# Patient Record
Sex: Male | Born: 1970 | Race: Asian | Hispanic: No | Marital: Married | Smoking: Never smoker
Health system: Southern US, Community
[De-identification: ages and names within clinical notes are randomized; demographics above are authoritative.]

## PROBLEM LIST (undated history)

## (undated) DIAGNOSIS — J3089 Other allergic rhinitis: Secondary | ICD-10-CM

## (undated) DIAGNOSIS — R0602 Shortness of breath: Secondary | ICD-10-CM

## (undated) DIAGNOSIS — M19019 Primary osteoarthritis, unspecified shoulder: Secondary | ICD-10-CM

## (undated) DIAGNOSIS — Z8709 Personal history of other diseases of the respiratory system: Secondary | ICD-10-CM

## (undated) DIAGNOSIS — E039 Hypothyroidism, unspecified: Secondary | ICD-10-CM

## (undated) DIAGNOSIS — Z87898 Personal history of other specified conditions: Secondary | ICD-10-CM

## (undated) DIAGNOSIS — T7840XA Allergy, unspecified, initial encounter: Secondary | ICD-10-CM

## (undated) DIAGNOSIS — R0683 Snoring: Secondary | ICD-10-CM

## (undated) DIAGNOSIS — K4090 Unilateral inguinal hernia, without obstruction or gangrene, not specified as recurrent: Secondary | ICD-10-CM

## (undated) HISTORY — DX: Primary osteoarthritis, unspecified shoulder: M19.019

## (undated) HISTORY — DX: Allergy, unspecified, initial encounter: T78.40XA

## (undated) HISTORY — DX: Other allergic rhinitis: J30.89

## (undated) HISTORY — DX: Snoring: R06.83

## (undated) HISTORY — DX: Unilateral inguinal hernia, without obstruction or gangrene, not specified as recurrent: K40.90

## (undated) HISTORY — DX: Shortness of breath: R06.02

## (undated) HISTORY — DX: Hypothyroidism, unspecified: E03.9

## (undated) HISTORY — DX: Personal history of other specified conditions: Z87.898

## (undated) HISTORY — DX: Personal history of other diseases of the respiratory system: Z87.09

---

## 2010-03-10 DIAGNOSIS — Z8709 Personal history of other diseases of the respiratory system: Secondary | ICD-10-CM

## 2010-03-10 HISTORY — DX: Personal history of other diseases of the respiratory system: Z87.09

## 2010-11-22 ENCOUNTER — Encounter: Payer: Self-pay | Admitting: Internal Medicine

## 2010-11-22 ENCOUNTER — Other Ambulatory Visit (INDEPENDENT_AMBULATORY_CARE_PROVIDER_SITE_OTHER): Payer: BC Managed Care – PPO

## 2010-11-22 ENCOUNTER — Ambulatory Visit (INDEPENDENT_AMBULATORY_CARE_PROVIDER_SITE_OTHER): Payer: BC Managed Care – PPO | Admitting: Internal Medicine

## 2010-11-22 ENCOUNTER — Other Ambulatory Visit: Payer: Self-pay | Admitting: Internal Medicine

## 2010-11-22 VITALS — BP 110/80 | HR 80 | Temp 98.5°F | Resp 16 | Ht 68.0 in | Wt 186.0 lb

## 2010-11-22 DIAGNOSIS — Z0000 Encounter for general adult medical examination without abnormal findings: Secondary | ICD-10-CM

## 2010-11-22 DIAGNOSIS — Z8042 Family history of malignant neoplasm of prostate: Secondary | ICD-10-CM

## 2010-11-22 DIAGNOSIS — E039 Hypothyroidism, unspecified: Secondary | ICD-10-CM

## 2010-11-22 LAB — CBC WITH DIFFERENTIAL/PLATELET
Basophils Relative: 0.9 % (ref 0.0–3.0)
Eosinophils Relative: 1.7 % (ref 0.0–5.0)
HCT: 46.2 % (ref 39.0–52.0)
Monocytes Relative: 12.1 % — ABNORMAL HIGH (ref 3.0–12.0)
Neutrophils Relative %: 41.6 % — ABNORMAL LOW (ref 43.0–77.0)
Platelets: 252 10*3/uL (ref 150.0–400.0)
RBC: 5 Mil/uL (ref 4.22–5.81)
WBC: 7.6 10*3/uL (ref 4.5–10.5)

## 2010-11-22 LAB — URINALYSIS, ROUTINE W REFLEX MICROSCOPIC
Hgb urine dipstick: NEGATIVE
Ketones, ur: NEGATIVE
Urine Glucose: NEGATIVE
Urobilinogen, UA: 0.2 (ref 0.0–1.0)

## 2010-11-22 LAB — TSH: TSH: 5.3 u[IU]/mL (ref 0.35–5.50)

## 2010-11-22 LAB — LIPID PANEL
Total CHOL/HDL Ratio: 6
VLDL: 34 mg/dL (ref 0.0–40.0)

## 2010-11-22 MED ORDER — LEVOTHYROXINE SODIUM 88 MCG PO TABS: 88.0000 ug | ORAL_TABLET | Freq: Every day | ORAL | Status: DC

## 2010-11-22 NOTE — Assessment & Plan Note (Signed)
Restart synthroid ?

## 2010-11-22 NOTE — Patient Instructions (Signed)
Health Maintenance in Males MAINTAIN REGULAR HEALTH EXAMS  Maintain a healthy diet and normal weight. Increased weight leads to problems with blood pressure and diabetes. Decrease fat in the diet and increase exercise. Obtain a proper diet from your caregiver if necessary.   High blood pressure causes heart and blood vessel problems. Check blood pressures regularly and keep your blood pressure at normal limits. Aerobic exercise helps this. Persistent elevations of blood pressure should be treated with medications if weight loss and exercise are ineffective.   Avoid smoking, drinking in excess (more than 2 drinks per day), or use of street drugs. Do not share needles with anyone. Ask for help if you need assistance or instructions on stopping the use of alcohol, cigarettes, or drugs.   Maintain normal blood lipids and cholesterol. Your caregiver can give you information to lower your risk of heart disease or stroke.   Ask your caregiver if you are in need of early heart disease screening because of a strong family history of heart disease or signs of elevated testosterone (male sex hormone) levels. These can predispose you to early heart disease.   Practice safe sex. Practicing safe sex decreases your risk for a sexually transmitted infection (STI). Some of the STIs are gonorrhea, chlamydia, syphilis, trichimonas, herpes, human papillomavirus (HPV), and human immunodeficiency virus (HIV). Herpes, HIV, and HPV are viral illnesses that have no cure. These can result in disability, cancer, and death.   It is not safe for someone who has AIDS or is HIV positive to have unprotected sex with a partner who is HIV positive. The reason for this is the fact that there are many different strains of HIV. If you have a strain that is readily treated with medications and then suddenly introduce a strain from a partner that has no further treatment options, you may suddenly have a strain of HIV that is untreatable.  Even if you are both positive for HIV, it is still necessary to practice safe sex.   Use sunscreen with a SPF of 15 or greater. Being outside in the sun when your shadow caused by the sun is shorter than you are, means you are being exposed to sun at greater intensity. Lighter skinned people are at a greater risk of skin cancer.   Keep carbon monoxide and smoke detectors in your home and functioning at all times. Change the batteries every 6 months.   Do monthly examinations of your testicles. The best time to do this is after a hot shower or bath when the tissues are loose. Notify your caregivers of any lumps, tenderness, or changes in size or shape.   Notify your caregiver of new moles or changes in moles, especially if there is a change in shape or color. Also notify your caregiver if a mole is larger than the size of a pencil eraser.   Stay current with your tetanus shots and other required immunizations.  The Body Mass Index (BMI) is a way of measuring how much of your body is fat. Having a BMI above 27 increases the risk of heart disease, diabetes, hypertension, stroke, and other problems related to obesity. Document Released: 08/23/2007 Document Re-Released: 08/14/2009 ExitCare Patient Information 2011 [employer]. 

## 2010-11-22 NOTE — Assessment & Plan Note (Signed)
Exam done, labs ordered, pt ed material was given 

## 2010-11-22 NOTE — Progress Notes (Signed)
Subjective:    Patient ID: Bruce [name], male    DOB: May 21, 1970, 40 y.o.   MRN: [id number]  Thyroid Problem Presents for follow-up visit. Symptoms include fatigue and weight gain. Patient reports no anxiety, cold intolerance, constipation, depressed mood, diaphoresis, diarrhea, dry skin, hair loss, heat intolerance, hoarse voice, leg swelling, menstrual problem, nail problem, palpitations, tremors, visual change or weight loss. The symptoms have been worsening (he has not taken synthroid for about 3 weeks).      Review of Systems  Constitutional: Positive for weight gain, fatigue and unexpected weight change (weight gain). Negative for fever, chills, weight loss, diaphoresis, activity change and appetite change.  HENT: Negative for sore throat, hoarse voice, facial swelling, trouble swallowing, neck pain, neck stiffness and voice change.   Eyes: Negative.   Respiratory: Negative for apnea, cough, choking, chest tightness, shortness of breath, wheezing and stridor.   Cardiovascular: Negative for chest pain, palpitations and leg swelling.  Gastrointestinal: Negative for nausea, vomiting, abdominal pain, diarrhea, constipation, blood in stool, abdominal distention and anal bleeding.  Genitourinary: Negative for dysuria, urgency, frequency, hematuria, flank pain, decreased urine volume, discharge, penile swelling, scrotal swelling, enuresis, difficulty urinating, genital sores, penile pain, testicular pain and menstrual problem.  Musculoskeletal: Negative for myalgias, back pain, joint swelling, arthralgias and gait problem.  Skin: Negative for color change, pallor, rash and wound.  Neurological: Negative for dizziness, tremors, seizures, syncope, facial asymmetry, speech difficulty, weakness, light-headedness, numbness and headaches.  Hematological: Negative for cold intolerance, heat intolerance and adenopathy. Does not bruise/bleed easily.  Psychiatric/Behavioral: Negative.          Objective:   Physical Exam  Vitals reviewed. Constitutional: He is oriented to person, place, and time. He appears well-developed and well-nourished. No distress.  HENT:  Mouth/Throat: Oropharynx is clear and moist. No oropharyngeal exudate.  Eyes: Conjunctivae are normal. Right eye exhibits no discharge. Left eye exhibits no discharge. No scleral icterus.  Neck: Normal range of motion. Neck supple. No JVD present. No tracheal deviation present. No thyromegaly present.  Cardiovascular: Normal rate, regular rhythm, normal heart sounds and intact distal pulses.  Exam reveals no gallop and no friction rub.   No murmur heard. Pulmonary/Chest: Effort normal and breath sounds normal. No stridor. No respiratory distress. He has no wheezes. He has no rales. He exhibits no tenderness.  Abdominal: Soft. Bowel sounds are normal. He exhibits no distension and no mass. There is no tenderness. There is no rebound and no guarding. Hernia confirmed negative in the right inguinal area and confirmed negative in the left inguinal area.  Genitourinary: Rectum normal, prostate normal, testes normal and penis normal. Rectal exam shows no external hemorrhoid, no internal hemorrhoid, no fissure, no mass, no tenderness and anal tone normal. Guaiac negative stool. Prostate is not enlarged and not tender. Right testis shows no mass, no swelling and no tenderness. Right testis is descended. Left testis shows no mass, no swelling and no tenderness. Left testis is descended. Uncircumcised. No phimosis, paraphimosis, hypospadias, penile erythema or penile tenderness. No discharge found.  Musculoskeletal: Normal range of motion. He exhibits no edema and no tenderness.  Lymphadenopathy:    He has no cervical adenopathy.       Right: No inguinal adenopathy present.       Left: No inguinal adenopathy present.  Neurological: He is oriented to person, place, and time. He displays normal reflexes. He exhibits normal muscle tone.   Skin: Skin is warm and dry. No rash noted. He is not diaphoretic.  No erythema. No pallor.  Psychiatric: He has a normal mood and affect. His behavior is normal. Judgment and thought content normal.          Assessment & Plan:

## 2010-11-22 NOTE — Assessment & Plan Note (Signed)
PSA and UA today 

## 2010-11-25 ENCOUNTER — Encounter: Payer: Self-pay | Admitting: Internal Medicine

## 2010-11-25 LAB — COMPREHENSIVE METABOLIC PANEL
Albumin: 4.2 g/dL (ref 3.5–5.2)
CO2: 25 mEq/L (ref 19–32)
Calcium: 9.2 mg/dL (ref 8.4–10.5)
GFR: 100.91 mL/min (ref >60.00)
Glucose, Bld: 103 mg/dL — ABNORMAL HIGH (ref 70–99)
Potassium: 3.9 mEq/L (ref 3.5–5.1)
Sodium: 141 mEq/L (ref 135–145)
Total Protein: 7.2 g/dL (ref 6.0–8.3)

## 2010-11-26 ENCOUNTER — Ambulatory Visit: Payer: Self-pay | Admitting: Internal Medicine

## 2011-03-11 DIAGNOSIS — M19019 Primary osteoarthritis, unspecified shoulder: Secondary | ICD-10-CM

## 2011-03-11 HISTORY — DX: Primary osteoarthritis, unspecified shoulder: M19.019

## 2011-07-15 ENCOUNTER — Ambulatory Visit: Payer: BC Managed Care – PPO | Admitting: Family Medicine

## 2011-07-18 ENCOUNTER — Encounter: Payer: Self-pay | Admitting: Family Medicine

## 2011-07-18 ENCOUNTER — Ambulatory Visit (INDEPENDENT_AMBULATORY_CARE_PROVIDER_SITE_OTHER): Payer: BC Managed Care – PPO | Admitting: Family Medicine

## 2011-07-18 VITALS — BP 114/75 | HR 51 | Temp 98.0°F | Ht 68.0 in | Wt 183.8 lb

## 2011-07-18 DIAGNOSIS — Z0000 Encounter for general adult medical examination without abnormal findings: Secondary | ICD-10-CM

## 2011-07-18 DIAGNOSIS — E039 Hypothyroidism, unspecified: Secondary | ICD-10-CM

## 2011-07-18 DIAGNOSIS — Z8042 Family history of malignant neoplasm of prostate: Secondary | ICD-10-CM

## 2011-07-18 NOTE — Progress Notes (Signed)
Office Note 07/22/2011  CC:  Chief Complaint  Patient presents with  . Establish Care    new patient    HPI:  Bruce [name] is a 41 y.o. [place] male who is here to establish care and get CPE. Patient's most recent primary MD: saw Dr. Yetta Barre at the [facility] office on [address] 11/2010.  Also, has seen a PA named Gwenlyn Perking at [place] [facility] in [facility] since that time, therefore he is classified as a new pt to [place]. Old records in EPIC/HL were reviewed prior to or during today's visit.  He mentions problems with fatigue, excessive daytime sleepiness, snoring + wife notes periods of apnea during sleep.  Recurrent URI's last few months that he feels may be related to OSA. No RLS.  He sees an endocrinologist in [place] and recently had his brand name synthroid dose increased from to 100 mcg qd (2 mo ago per pt).   Past Medical History  Diagnosis Date  . Hypothyroidism     Managed by endo ([facility])  . Jaundice as a child  . 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    History reviewed. No pertinent past surgical history.  Family History  Problem Relation Age of Onset  . Hypertension Mother   . Prostate cancer Father   . Cancer Father 46    prostate    History   Social History  . Marital Status: Married    Spouse Name: N/A    Number of Children: N/A  . Years of Education: N/A   Occupational History  . Not on file.   Social History Main Topics  . Smoking status: Never Smoker   . Smokeless tobacco: Never Used  . Alcohol Use: 0.6 oz/week    1 Shots of liquor per week     occasionally  . Drug Use: No  . Sexually Active: Yes   Other Topics Concern  . Not on file   Social History Narrative   Married, 2 children (ages 64 and 2).Master's degree in [place].  Emergency planning/management officer for [employer]No tobacco, rare alcohol, no  drug use.Exercises regularly.  Typical american diet except avoids red meat.     Outpatient Encounter Prescriptions as of 07/18/2011  Medication Sig Dispense Refill  . levothyroxine (SYNTHROID, LEVOTHROID) 100 MCG tablet Take 100 mcg by mouth daily.      Marland Kitchen DISCONTD: levothyroxine (SYNTHROID, LEVOTHROID) 88 MCG tablet Take 1 tablet (88 mcg total) by mouth daily.  90 tablet  3    No Known Allergies  ROS Review of Systems  Constitutional: Negative for fever, chills, appetite change and fatigue.  HENT: Negative for ear pain, congestion, sore throat, neck stiffness and dental problem.   Eyes: Negative for discharge, redness and visual disturbance.  Respiratory: Negative for cough, chest tightness, shortness of breath and wheezing.   Cardiovascular: Negative for chest pain, palpitations and leg swelling.  Gastrointestinal: Negative for nausea, vomiting, abdominal pain, diarrhea and blood in stool.  Genitourinary: Negative for dysuria, urgency, frequency, hematuria, flank pain and difficulty urinating.  Musculoskeletal: Positive for arthralgias (left shoulder). Negative for myalgias, back pain and joint swelling.  Skin: Negative for pallor and rash.  Neurological: Negative for dizziness, speech difficulty, weakness and headaches.  Hematological: Negative for adenopathy. Does not bruise/bleed easily.  Psychiatric/Behavioral: Negative for confusion and sleep disturbance. The patient  is not nervous/anxious.     PE; Blood pressure 114/75, pulse 51, temperature 98 F (36.7 C), temperature source Temporal, height 5\' 8"  (1.727 m), weight 183 lb 12.8 oz (83.371 kg), SpO2 98.00%. Gen: Alert, well appearing.  Patient is oriented to person, place, time, and situation. ENT: Ears: EACs clear, normal epithelium.  TMs with good light reflex and landmarks bilaterally.  Eyes: no injection, icteris, swelling, or exudate.  EOMI, PERRLA. Nose: no drainage or turbinate edema/swelling.  No injection or focal  lesion.  Mouth: lips without lesion/swelling.  Oral mucosa pink and moist.  Dentition intact and without obvious caries or gingival swelling.  Oropharynx without erythema, exudate, or swelling.  Neck: supple/nontender.  No LAD, mass, or TM.  Carotid pulses 2+ bilaterally, without bruits. CV: RRR, no m/r/g.   LUNGS: CTA bilat, nonlabored resps, good aeration in all lung fields. ABD: soft, NT, ND, BS normal.  No hepatospenomegaly or mass.  No bruits. EXT: no clubbing, cyanosis, or edema.  Skin - no sores or suspicious lesions or rashes or color changes Rectal/prostate: deferred (this was done on exam 11/2010 and was normal.  Pertinent labs:    Chemistry      Component Value Date/Time   NA 141 11/22/2010 1525   K 3.9 11/22/2010 1525   CL 106 11/22/2010 1525   CO2 25 11/22/2010 1525   BUN 16 11/22/2010 1525   CREATININE 0.9 11/22/2010 1525      Component Value Date/Time   CALCIUM 9.2 11/22/2010 1525   ALKPHOS 67 11/22/2010 1525   AST 20 11/22/2010 1525   ALT 21 11/22/2010 1525   BILITOT 0.7 11/22/2010 1525     Lab Results  Component Value Date   WBC 7.6 11/22/2010   HGB 15.7 11/22/2010   HCT 46.2 11/22/2010   MCV 92.5 11/22/2010   PLT 252.0 11/22/2010   Lab Results  Component Value Date   PSA 0.63 11/22/2010   Lab Results  Component Value Date   TSH 5.30 11/22/2010     ASSESSMENT AND PLAN:   Health maintenance examination Reviewed age and gender appropriate health maintenance issues (prudent diet, regular exercise, health risks of tobacco and excessive alcohol, use of seatbelts, fire alarms in home, use of sunscreen).  Also reviewed age and gender appropriate health screening as well as vaccine recommendations. Will see if old records at [facility] show vaccine record. PSA and other routine labs were done 11/2010 and were unremarkable so no labs are indicated today. He gives some history suggestive of OSA, although sleep questionnaire today was low risk. I offered to order a home sleep  study but he said he wanted to wait and discuss it more with his wife.  Family history of prostate cancer Gets annual PSAs and DREs since turning 38--normal so far.  Hypothyroidism As per specialist management.       Return if symptoms worsen or fail to improve.

## 2011-07-18 NOTE — Patient Instructions (Signed)
Health Maintenance, Males A healthy lifestyle and preventative care can promote health and wellness.  Maintain regular health, dental, and eye exams.   Eat a healthy diet. Foods like vegetables, fruits, whole grains, low-fat dairy products, and lean protein foods contain the nutrients you need without too many calories. Decrease your intake of foods high in solid fats, added sugars, and salt. Get information about a proper diet from your caregiver, if necessary.   Regular physical exercise is one of the most important things you can do for your health. Most adults should get at least 150 minutes of moderate-intensity exercise (any activity that increases your heart rate and causes you to sweat) each week. In addition, most adults need muscle-strengthening exercises on 2 or more days a week.    Maintain a healthy weight. The body mass index (BMI) is a screening tool to identify possible weight problems. It provides an estimate of body fat based on height and weight. Your caregiver can help determine your BMI, and can help you achieve or maintain a healthy weight. For adults 20 years and older:   A BMI below 18.5 is considered underweight.   A BMI of 18.5 to 24.9 is normal.   A BMI of 25 to 29.9 is considered overweight.   A BMI of 30 and above is considered obese.   Maintain normal blood lipids and cholesterol by exercising and minimizing your intake of saturated fat. Eat a balanced diet with plenty of fruits and vegetables. Blood tests for lipids and cholesterol should begin at age 20 and be repeated every 5 years. If your lipid or cholesterol levels are high, you are over 50, or you are a high risk for heart disease, you may need your cholesterol levels checked more frequently.Ongoing high lipid and cholesterol levels should be treated with medicines, if diet and exercise are not effective.   If you smoke, find out from your caregiver how to quit. If you do not use tobacco, do not start.    If you choose to drink alcohol, do not exceed 2 drinks per day. One drink is considered to be 12 ounces (355 mL) of beer, 5 ounces (148 mL) of wine, or 1.5 ounces (44 mL) of liquor.   Avoid use of street drugs. Do not share needles with anyone. Ask for help if you need support or instructions about stopping the use of drugs.   High blood pressure causes heart disease and increases the risk of stroke. Blood pressure should be checked at least every 1 to 2 years. Ongoing high blood pressure should be treated with medicines if weight loss and exercise are not effective.   If you are 45 to 43 years old, ask your caregiver if you should take aspirin to prevent heart disease.   Diabetes screening involves taking a blood sample to check your fasting blood sugar level. This should be done once every 3 years, after age 45, if you are within normal weight and without risk factors for diabetes. Testing should be considered at a younger age or be carried out more frequently if you are overweight and have at least 1 risk factor for diabetes.   Colorectal cancer can be detected and often prevented. Most routine colorectal cancer screening begins at the age of 50 and continues through age 75. However, your caregiver may recommend screening at an earlier age if you have risk factors for colon cancer. On a yearly basis, your caregiver may provide home test kits to check for hidden   blood in the stool. Use of a small camera at the end of a tube, to directly examine the colon (sigmoidoscopy or colonoscopy), can detect the earliest forms of colorectal cancer. Talk to your caregiver about this at age 50, when routine screening begins. Direct examination of the colon should be repeated every 5 to 10 years through age 75, unless early forms of pre-cancerous polyps or small growths are found.   Hepatitis C blood testing is recommended for all people born from 1945 through 1965 and any individual with known risks for  hepatitis C.   Healthy men should no longer receive prostate-specific antigen (PSA) blood tests as part of routine cancer screening. Consult with your caregiver about prostate cancer screening.   Testicular cancer screening is not recommended for adolescents or adult males who have no symptoms. Screening includes self-exam, caregiver exam, and other screening tests. Consult with your caregiver about any symptoms you have or any concerns you have about testicular cancer.   Practice safe sex. Use condoms and avoid high-risk sexual practices to reduce the spread of sexually transmitted infections (STIs).   Use sunscreen with a sun protection factor (SPF) of 30 or greater. Apply sunscreen liberally and repeatedly throughout the day. You should seek shade when your shadow is shorter than you. Protect yourself by wearing long sleeves, pants, a wide-brimmed hat, and sunglasses year round, whenever you are outdoors.   Notify your caregiver of new moles or changes in moles, especially if there is a change in shape or color. Also notify your caregiver if a mole is larger than the size of a pencil eraser.   A one-time screening for abdominal aortic aneurysm (AAA) and surgical repair of large AAAs by sound wave imaging (ultrasonography) is recommended for ages 65 to 75 years who are current or former smokers.   Stay current with your immunizations.  Document Released: 08/23/2007 Document Revised: 02/13/2011 Document Reviewed: 07/22/2010 ExitCare Patient Information 2012 [employer]. 

## 2011-07-22 ENCOUNTER — Encounter: Payer: Self-pay | Admitting: Family Medicine

## 2011-07-22 DIAGNOSIS — E039 Hypothyroidism, unspecified: Secondary | ICD-10-CM | POA: Insufficient documentation

## 2011-07-22 DIAGNOSIS — Z0000 Encounter for general adult medical examination without abnormal findings: Secondary | ICD-10-CM | POA: Insufficient documentation

## 2011-07-22 NOTE — Assessment & Plan Note (Addendum)
Reviewed age and gender appropriate health maintenance issues (prudent diet, regular exercise, health risks of tobacco and excessive alcohol, use of seatbelts, fire alarms in home, use of sunscreen).  Also reviewed age and gender appropriate health screening as well as vaccine recommendations. Will see if old records at [facility] show vaccine record. PSA and other routine labs were done 11/2010 and were unremarkable so no labs are indicated today. He gives some history suggestive of OSA, although sleep questionnaire today was low risk. I offered to order a home sleep study but he said he wanted to wait and discuss it more with his wife.

## 2011-07-22 NOTE — Assessment & Plan Note (Signed)
As per specialist management.

## 2011-07-22 NOTE — Assessment & Plan Note (Signed)
Gets annual PSAs and DREs since turning 38--normal so far.

## 2011-12-05 ENCOUNTER — Other Ambulatory Visit: Payer: Self-pay | Admitting: Family Medicine

## 2011-12-05 ENCOUNTER — Telehealth: Payer: Self-pay | Admitting: Family Medicine

## 2011-12-05 DIAGNOSIS — M19019 Primary osteoarthritis, unspecified shoulder: Secondary | ICD-10-CM

## 2011-12-05 DIAGNOSIS — E039 Hypothyroidism, unspecified: Secondary | ICD-10-CM

## 2011-12-05 NOTE — Telephone Encounter (Signed)
Can you enter orders or do I need to call pt for more information?

## 2011-12-05 NOTE — Telephone Encounter (Signed)
I'll put in the orders for the endocrinologist and the orthopedist.  Ask him if he wants a sleep study in the sleep lab or does he want a home sleep study (we had discussed a home sleep study as a possibility when he was in the office last).  Let me know and I'll put the order in.--thx

## 2011-12-09 ENCOUNTER — Other Ambulatory Visit: Payer: Self-pay | Admitting: Family Medicine

## 2011-12-09 ENCOUNTER — Telehealth: Payer: Self-pay | Admitting: Family Medicine

## 2011-12-09 DIAGNOSIS — R400 Somnolence: Secondary | ICD-10-CM

## 2011-12-09 NOTE — Telephone Encounter (Signed)
OK.  Will put order/referral in EMR.-thx

## 2011-12-09 NOTE — Telephone Encounter (Signed)
Patient is requesting sleep referral with [facility].

## 2011-12-16 NOTE — Telephone Encounter (Signed)
Pt spoke with Diane and sleep study has been ordered through [facility].

## 2011-12-30 ENCOUNTER — Telehealth: Payer: Self-pay | Admitting: Family Medicine

## 2011-12-30 NOTE — Telephone Encounter (Signed)
Please advise 

## 2011-12-30 NOTE — Telephone Encounter (Signed)
Pt notified.  Pt does not want to go to UC and pay extra fees knowing they will prescribe him the same medication as always.  Pt states he knows it is just a cold and he just needs and antibiotic.  Advised colds are viral and antibiotics do not take care of viral illnesses.  Asked pt twice what he was taking OTC and both times pt began talking about symptoms and getting this illness every two months.  Advised again to seek care at an UC and if unable to do that and still feeling bad when he returns home to call for an appt.

## 2011-12-30 NOTE — Telephone Encounter (Signed)
Please notify this patient that I'm sorry but calling in an antibiotic is not in his best interest from a health standpoint b/c I have not seen him and evaluated him for this illness.  He needs to see an MD where he is currently so he can get accurate diagnosis and appropriate treatment.  --thx

## 2011-12-30 NOTE — Telephone Encounter (Signed)
Caller: [name]/Patient; Patient Name: Bruce [name]; PCP: Earley Favor [facility]); Best Callback Phone Number: [phone] Onset- 12/26/11  Pt feels like he may have a fever but he is in hotel with no access to thermometer right now.  He has cough, nasal congestion with yelow-green drainage, headache and feels achy. He is using  Catering manager and  Tylenol. Emergent s/s of cough protocol r/o. Pt to see provider within 24hrs. Pt is aware of antibiotic policy must be seen in office. Pt is requesting a message be sent he is out of town and wants to know if if an antibiotic can be sent to pharmacy so he will not have to go to Urgent care. Pharmacy is [facility], phone [phone]. The address is [address]. [place], [place]  [address]

## 2012-01-09 HISTORY — PX: OTHER SURGICAL HISTORY: SHX169

## 2012-02-04 ENCOUNTER — Encounter: Payer: Self-pay | Admitting: Family Medicine

## 2012-02-04 ENCOUNTER — Telehealth: Payer: Self-pay | Admitting: Family Medicine

## 2012-02-04 NOTE — Telephone Encounter (Signed)
Pls notify pt that his home sleep study was normal.  No obstructive sleep apnea.-thx

## 2012-02-06 NOTE — Telephone Encounter (Signed)
I have attempted to contact this patient by phone with the following results: left message to return my call on answering machine (mobile).  

## 2012-02-06 NOTE — Telephone Encounter (Signed)
Pt notified and is agreeable with plan. 

## 2012-02-16 ENCOUNTER — Encounter: Payer: Self-pay | Admitting: Family Medicine

## 2012-02-26 ENCOUNTER — Telehealth: Payer: Self-pay | Admitting: Family Medicine

## 2012-02-26 NOTE — Telephone Encounter (Signed)
Pt notified.and agreeable

## 2012-02-26 NOTE — Telephone Encounter (Signed)
Pls notify pt that his sleep study was normal: NO sign of obstructive sleep apnea.  Reassure patient.--thx

## 2012-07-08 ENCOUNTER — Ambulatory Visit (INDEPENDENT_AMBULATORY_CARE_PROVIDER_SITE_OTHER)
Admission: RE | Admit: 2012-07-08 | Discharge: 2012-07-08 | Disposition: A | Discharge status: Home / Self Care | Payer: 59 | Source: Ambulatory Visit | Attending: Family Medicine | Admitting: Family Medicine

## 2012-07-08 ENCOUNTER — Ambulatory Visit (INDEPENDENT_AMBULATORY_CARE_PROVIDER_SITE_OTHER): Payer: 59 | Admitting: Family Medicine

## 2012-07-08 ENCOUNTER — Telehealth: Payer: Self-pay | Admitting: Family Medicine

## 2012-07-08 ENCOUNTER — Encounter: Payer: Self-pay | Admitting: Family Medicine

## 2012-07-08 VITALS — BP 120/78 | HR 74 | Temp 98.0°F | Resp 14 | Wt 189.0 lb

## 2012-07-08 DIAGNOSIS — S63619A Unspecified sprain of unspecified finger, initial encounter: Secondary | ICD-10-CM

## 2012-07-08 DIAGNOSIS — S6390XA Sprain of unspecified part of unspecified wrist and hand, initial encounter: Secondary | ICD-10-CM

## 2012-07-08 NOTE — Telephone Encounter (Signed)
Discussed x-ray result with pt: fracture of proximal/dorsal aspect of distal phalanx of 3rd finger on left hand, extending into articular surface of dist phalanx at the DIP jt. I recommended we have an orthopedist see him but he preferred to wait and see how his finger feels over the next week and f/u with me in 1 wk as planned (wear splint in the meantime). I agreed with this option, but I will ask a [facility] MD to view the x-ray and get their input on things.

## 2012-07-08 NOTE — Progress Notes (Signed)
OFFICE NOTE  07/08/2012  CC:  Chief Complaint  Patient presents with  . Finger Injury    Pt c/o Left middle finger injury w/swelling & bruising     HPI: Patient is a 42 y.o. middle [place] male who is here for finger injury. Occurred yesterday, playing basketball with son, thinks he jammed it and bent it forward at the tip as well.  Pertinent PMH:  Past Medical History  Diagnosis Date  . Hypothyroidism     Managed by endo ([facility])  . History of influenza 2012    influenza A + ([facility])  . 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   Past Surgical History  Procedure Laterality Date  . Sleep study  01/2012    Home sleep study via [employer] [facility]     MEDS:  Outpatient Prescriptions Prior to Visit  Medication Sig Dispense Refill  . levothyroxine (SYNTHROID, LEVOTHROID) 100 MCG tablet Take 100 mcg by mouth daily.       No facility-administered medications prior to visit.    PE: Blood pressure 120/78, pulse 74, temperature 98 F (36.7 C), temperature source Oral, resp. rate 14, weight 189 lb (85.73 kg), SpO2 98.00%. Gen: Alert, well appearing.  Patient is oriented to person, place, time, and situation. Left hand middle finger with a mild amount of erythema and swelling of DIP joint and distal phalanx.  He can flex and extend at the DIP joint ok.  Mild tenderness of DIP joint and distal phalanx of middle finger of left hand.  IMPRESSION AND PLAN:  Left hand middle finger sprain, r/o fracture. Will buddy tape and splint in office today.  Ibuprofen for pain/swelling discussed. Will refer to ortho if appropriate based on x-ray.  FOLLOW UP: 1 wk

## 2012-07-12 ENCOUNTER — Telehealth: Payer: Self-pay | Admitting: Family Medicine

## 2012-07-12 DIAGNOSIS — S62639A Displaced fracture of distal phalanx of unspecified finger, initial encounter for closed fracture: Secondary | ICD-10-CM

## 2012-07-12 NOTE — Telephone Encounter (Signed)
Pls contact pt and notify him that I discussed his case with one of my colleagues in Sports Medicine and he recommended that I refer him to an orthopedist for further evaluation and management of his fractured finger.  Continue wearing split until he sees orthopedist.  The order is in so he should be getting a call about the ortho appt soon.-thx

## 2012-07-12 NOTE — Telephone Encounter (Signed)
Attempted to reach patient via phone; home number states "your number cannot be completed as dialed [[phone]]", attempted to reach pt via mobile number, ringing busy/SLS

## 2012-07-12 NOTE — Telephone Encounter (Signed)
Patient informed, understood & agreed/SLS  

## 2012-07-15 ENCOUNTER — Ambulatory Visit: Payer: 59 | Admitting: Family Medicine

## 2013-06-22 ENCOUNTER — Other Ambulatory Visit: Payer: 59

## 2013-06-24 ENCOUNTER — Other Ambulatory Visit (INDEPENDENT_AMBULATORY_CARE_PROVIDER_SITE_OTHER): Payer: 59

## 2013-06-24 ENCOUNTER — Other Ambulatory Visit: Payer: Self-pay | Admitting: *Deleted

## 2013-06-24 DIAGNOSIS — E039 Hypothyroidism, unspecified: Secondary | ICD-10-CM

## 2013-06-24 LAB — TSH: TSH: 2.06 u[IU]/mL (ref 0.35–5.50)

## 2013-06-24 LAB — T4, FREE: Free T4: 1.01 ng/dL (ref 0.60–1.60)

## 2013-06-29 ENCOUNTER — Ambulatory Visit (INDEPENDENT_AMBULATORY_CARE_PROVIDER_SITE_OTHER): Payer: 59 | Admitting: Endocrinology

## 2013-06-29 ENCOUNTER — Encounter: Payer: Self-pay | Admitting: Endocrinology

## 2013-06-29 VITALS — BP 126/84 | HR 69 | Temp 98.1°F | Resp 14 | Ht 68.5 in | Wt 185.8 lb

## 2013-06-29 DIAGNOSIS — E039 Hypothyroidism, unspecified: Secondary | ICD-10-CM

## 2013-06-29 DIAGNOSIS — E7800 Pure hypercholesterolemia, unspecified: Secondary | ICD-10-CM

## 2013-06-29 NOTE — Progress Notes (Signed)
Patient ID: Bruce SeltzerSharan [name], male   DOB: 11-16-1970, 43 y.o.   MRN: [id number]  Reason for Appointment:  Hypothyroidism, followup visit    History of Present Illness:   The hypothyroidism was first diagnosed in 2012  Initially had only nonspecific fatigue and some cord intolerance. No details are available about the initial diagnosis but his TSH in 2012 was 5.3 The patient has been treated with Synthroid brand name 100 mcg daily The last visit was in 4/14   Patient has no complaints of unusual fatigue, cold sensitivity, dry skin, unusual weight gain or hair loss.            The patient is taking the thyroid supplement very regularly in the morning before breakfast. Not taking any calcium or iron supplements with the thyroid supplement.    On the last visit the TSH was 2.4 and the dose was continued unchanged He is asking about generic levothyroxine instead of brand name  Appointment on 06/24/2013  Component Date Value Ref Range Status  . TSH 06/24/2013 2.06  0.35 - 5.50 uIU/mL Final  . Free T4 06/24/2013 1.01  0.60 - 1.60 ng/dL Final      Medication List       This list is accurate as of: 06/29/13  2:30 PM.  Always use your most recent med list.               COMPLETE PROTEIN/VITAMIN SHAKE PO  Take by mouth.     levothyroxine 100 MCG tablet  Commonly known as:  SYNTHROID, LEVOTHROID  Take 100 mcg by mouth daily.     multivitamin tablet  Take 1 tablet by mouth daily.        Allergies: No Known Allergies  Past Medical History  Diagnosis Date  . Hypothyroidism     Managed by endo ([facility])  . History of influenza 2012    influenza A + ([facility])  . 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    Past Surgical History  Procedure Laterality Date  . Sleep study  01/2012    Home sleep study via [employer] [facility]     Family History  Problem Relation Age of Onset  . Hypertension Mother   . Prostate cancer Father   . Cancer Father [id number]    prostate    Social History:  reports that he has never smoked. He has never used smokeless tobacco. He reports that he drinks about .6 ounces of alcohol per week. He reports that he does not use illicit drugs.  REVIEW Of SYSTEMS:  No history of hypertension  Previous LDL was 163   Examination:   BP 126/84  Pulse 69  Temp(Src) 98.1 F (36.7 C)  Resp 14  Ht 5' 8.5" (1.74 m)  Wt 185 lb 12.8 oz (84.278 kg)  BMI 27.84 kg/m2  SpO2 96%  GENERAL APPEARANCE: Alert, no puffiness of the face or eyes  NECK: Thyroid is not palpable           NEUROLOGIC EXAM:  biceps reflexes show normal relaxation Skin: Not unusual dry    Assessment   Hypothyroidism, primary and mild, very stable with normal TSH on current regimen of 100 mcg daily Discussed with patient nature of autoimmune hypothyroidism and long-term natural history Since his levels have been very stable with brand name Synthroid would like  him to continue this but may consider generic also  Hypercholesterolemia: He will continue followup with PCP with this   Treatment:   Continue same dosage before breakfast daily. Avoid taking any calcium or iron supplements with the thyroid supplement.    Reather LittlerAjay Halah [name] 06/29/2013, 2:30 PM

## 2013-11-07 ENCOUNTER — Other Ambulatory Visit: Payer: Self-pay | Admitting: *Deleted

## 2013-11-07 MED ORDER — LEVOTHYROXINE SODIUM 100 MCG PO TABS: 100.0000 ug | ORAL_TABLET | Freq: Every day | ORAL | Status: DC

## 2013-11-11 ENCOUNTER — Telehealth: Payer: Self-pay | Admitting: Endocrinology

## 2013-11-11 ENCOUNTER — Other Ambulatory Visit: Payer: Self-pay | Admitting: *Deleted

## 2013-11-11 MED ORDER — LEVOTHYROXINE SODIUM 100 MCG PO TABS: 100.0000 ug | ORAL_TABLET | Freq: Every day | ORAL | Status: DC

## 2013-11-11 NOTE — Telephone Encounter (Signed)
Pt needs rx called in for synthryoid

## 2014-01-02 IMAGING — CR DG FINGER MIDDLE 2+V*L*
3 series · 3 of 3 positions shown · non-contrast
Comparison: None.

CLINICAL DATA: History of injury of the middle finger of the left
hand with sprain and contusion.

LEFT MIDDLE FINGER 2+V

[view not recorded (1 of 3)]
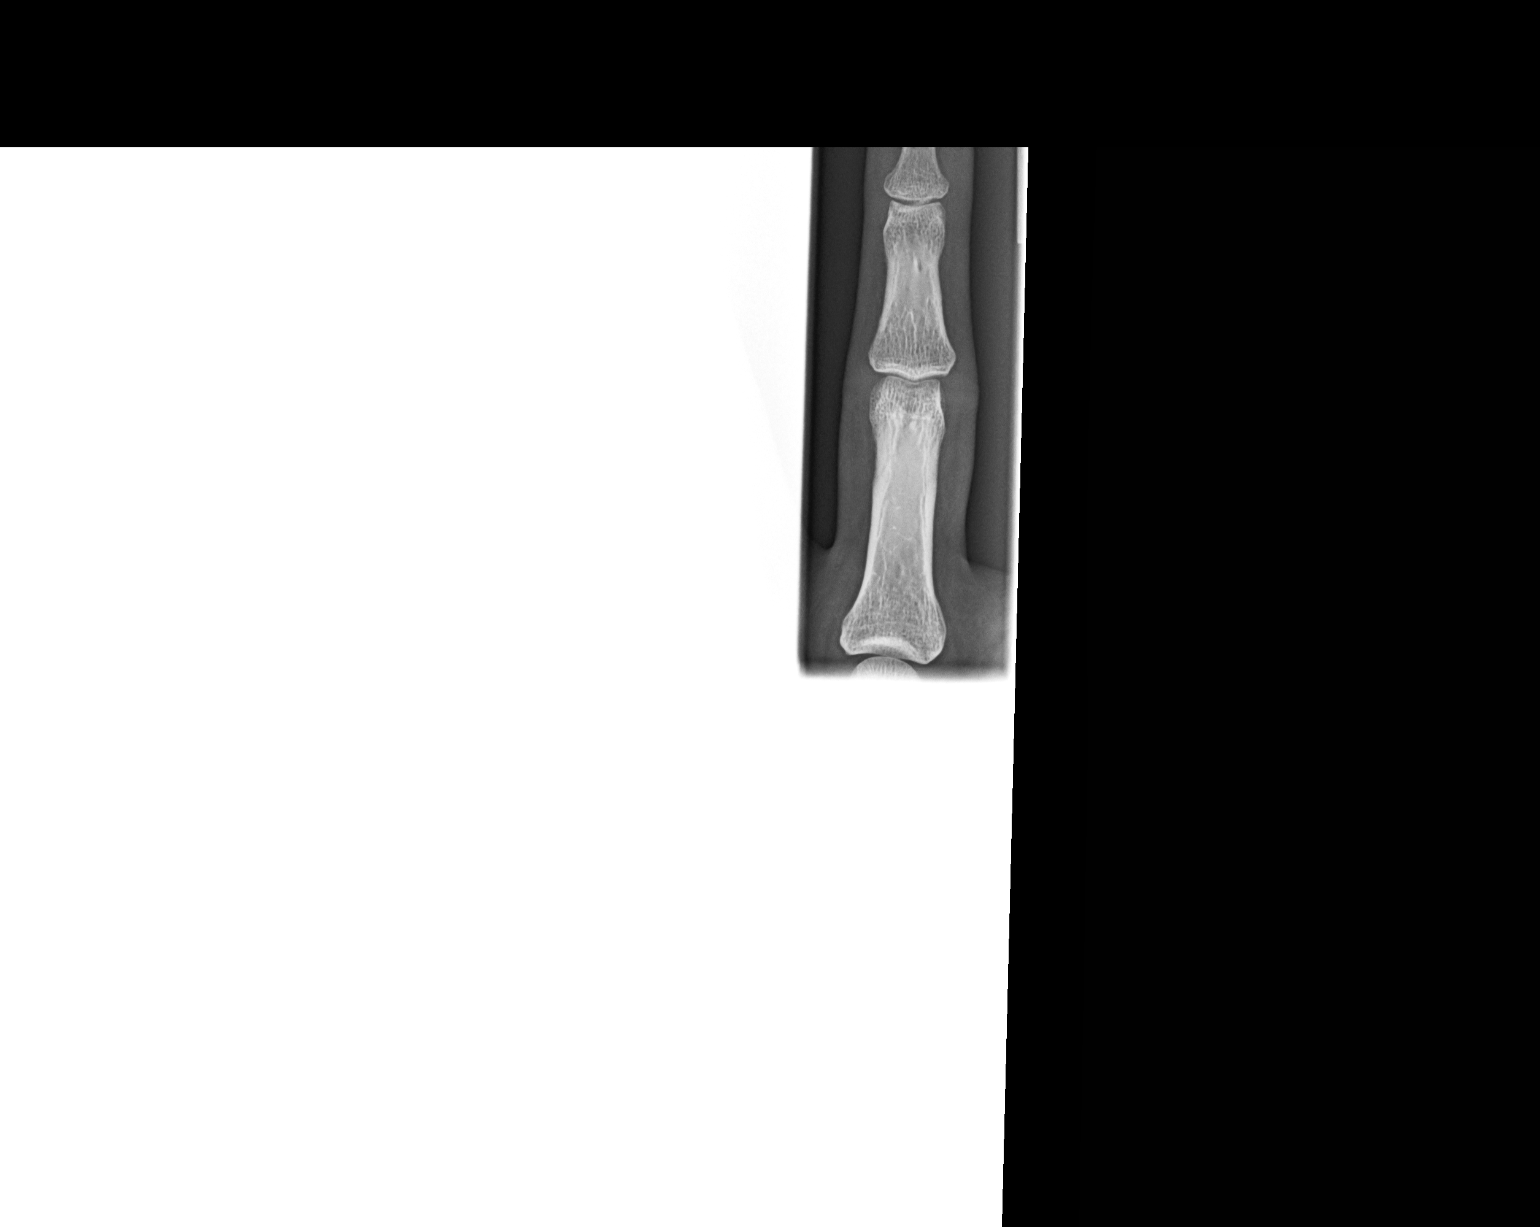

[view not recorded (2 of 3)]
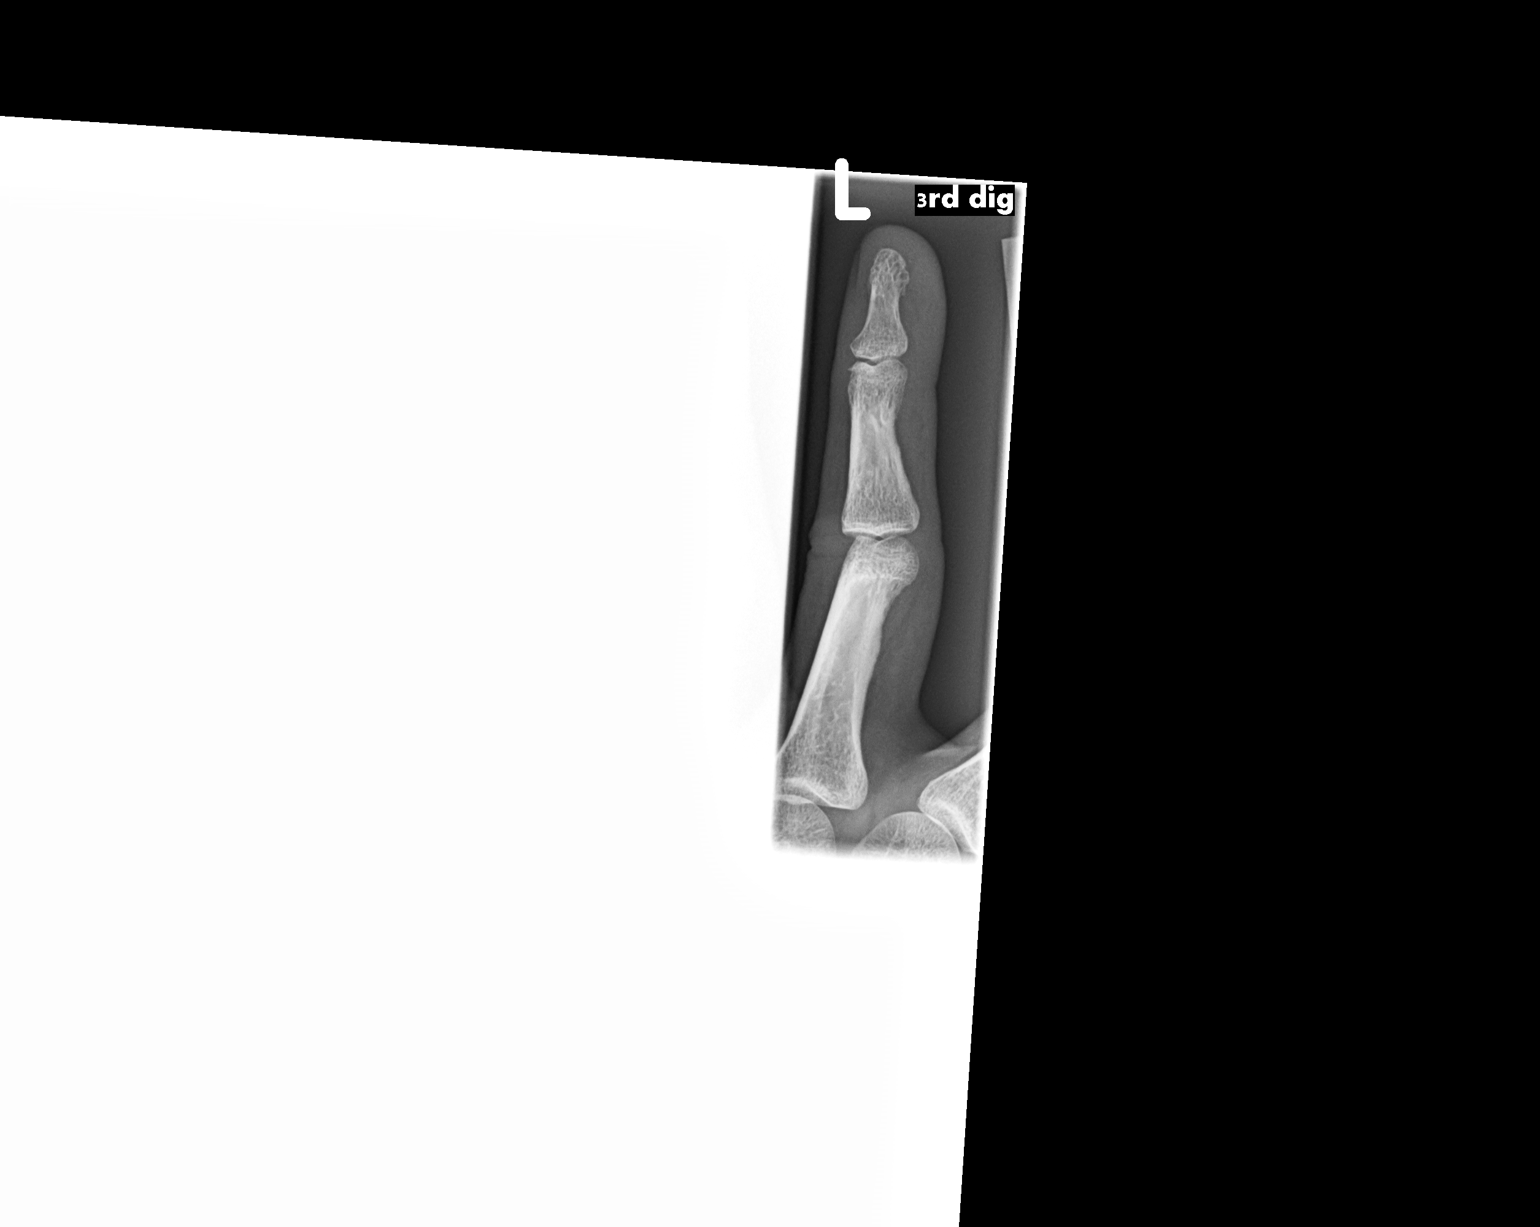

[view not recorded (3 of 3)]
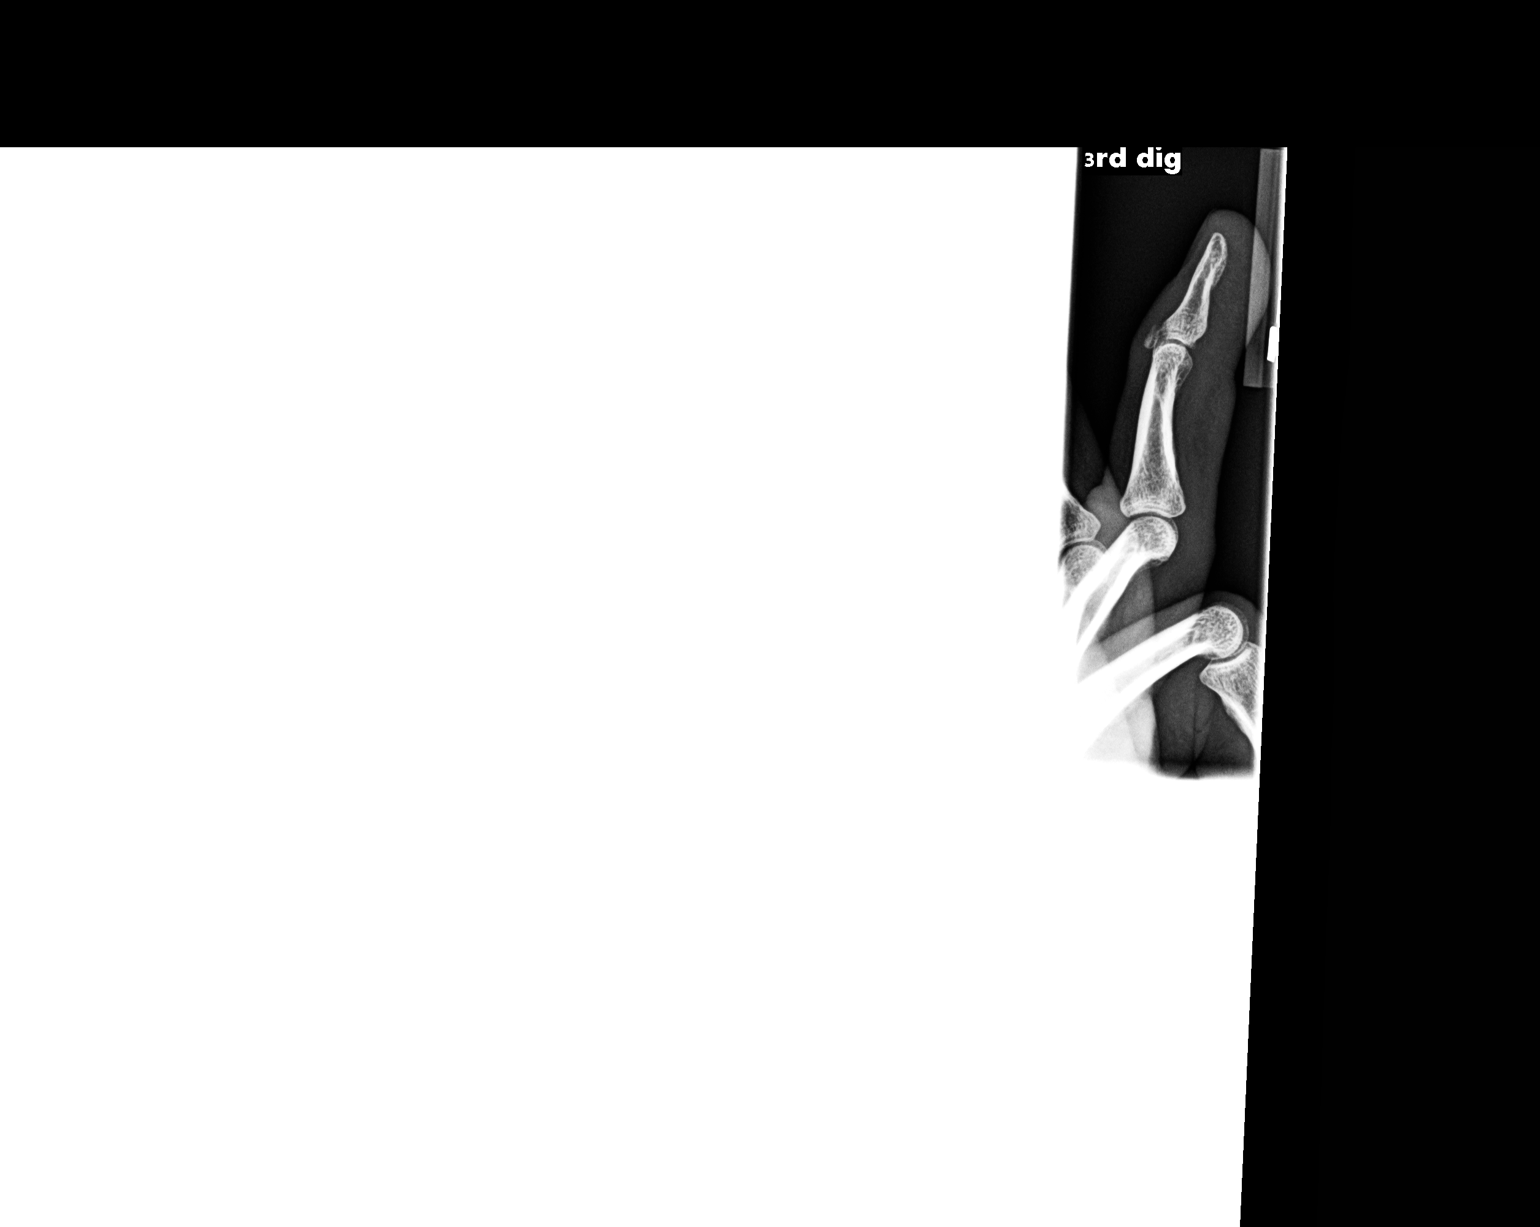

[3 of 3 positions shown; findings below may reference images not displayed]

FINDINGS: There is fracture of the proximal dorsal aspect of the
distal phalanx of the left third finger with involving the dorsal
aspect of the articular surface at the DIP joint.  There is
proximal and dorsal displacement of the proximal fracture fragment
with near anatomic alignment.  There is soft tissue swelling.  No
dislocation is evident.  No other fracture is seen.
IMPRESSION: Fracture of the proximal dorsal aspect of the distal phalanx of the
left third finger.  There is involvement of the dorsal aspect of
the articular surface at the DIP joint.  There is proximal dorsal
displacement of the proximal fracture fragment with near anatomic
alignment.

## 2014-01-13 ENCOUNTER — Other Ambulatory Visit: Payer: Self-pay | Admitting: *Deleted

## 2014-01-13 MED ORDER — LEVOTHYROXINE SODIUM 100 MCG PO TABS: 100.0000 ug | ORAL_TABLET | Freq: Every day | ORAL | Status: DC

## 2014-06-13 ENCOUNTER — Other Ambulatory Visit: Payer: Self-pay | Admitting: *Deleted

## 2014-06-13 ENCOUNTER — Telehealth: Payer: Self-pay | Admitting: Endocrinology

## 2014-06-13 MED ORDER — LEVOTHYROXINE SODIUM 100 MCG PO TABS: 100.0000 ug | ORAL_TABLET | Freq: Every day | ORAL | Status: DC

## 2014-06-13 NOTE — Telephone Encounter (Signed)
rx sent for 90 day supply, no refills, patient will need an appointment before other refills given

## 2014-06-13 NOTE — Telephone Encounter (Signed)
Patient canceled his appointment because lost his job , he would like to know if he could get another refill of his meds Synthroid . Please advise

## 2014-06-21 ENCOUNTER — Encounter: Payer: Self-pay | Admitting: Endocrinology

## 2014-06-23 ENCOUNTER — Other Ambulatory Visit: Payer: Self-pay | Admitting: *Deleted

## 2014-06-23 MED ORDER — LEVOTHYROXINE SODIUM 100 MCG PO TABS: 100.0000 ug | ORAL_TABLET | Freq: Every day | ORAL | Status: DC

## 2014-06-24 ENCOUNTER — Other Ambulatory Visit: Payer: Self-pay | Admitting: Endocrinology

## 2014-06-26 ENCOUNTER — Other Ambulatory Visit: Payer: 59

## 2014-06-30 ENCOUNTER — Ambulatory Visit: Payer: 59 | Admitting: Endocrinology

## 2014-08-04 ENCOUNTER — Telehealth: Payer: Self-pay | Admitting: Endocrinology

## 2014-08-04 ENCOUNTER — Other Ambulatory Visit: Payer: Self-pay | Admitting: *Deleted

## 2014-08-04 MED ORDER — LEVOTHYROXINE SODIUM 100 MCG PO TABS: ORAL_TABLET | ORAL | Status: DC

## 2014-08-04 NOTE — Telephone Encounter (Signed)
rx sent

## 2014-08-04 NOTE — Telephone Encounter (Signed)
Patient called regarding his levothyroxine Was sent as name brand (synthroid)  Please re-send Rx   Thank you

## 2014-09-21 ENCOUNTER — Ambulatory Visit: Payer: Self-pay | Admitting: Endocrinology

## 2014-10-26 ENCOUNTER — Ambulatory Visit: Payer: Self-pay | Admitting: Endocrinology

## 2014-10-27 ENCOUNTER — Other Ambulatory Visit: Payer: Self-pay | Admitting: *Deleted

## 2014-10-27 ENCOUNTER — Telehealth: Payer: Self-pay | Admitting: Endocrinology

## 2014-10-27 DIAGNOSIS — E039 Hypothyroidism, unspecified: Secondary | ICD-10-CM

## 2014-10-27 NOTE — Telephone Encounter (Signed)
Please send order over to [facility] [place] P# is [phone]

## 2014-10-27 NOTE — Telephone Encounter (Signed)
faxed

## 2014-10-30 ENCOUNTER — Ambulatory Visit: Payer: Self-pay | Admitting: Endocrinology

## 2014-10-31 ENCOUNTER — Ambulatory Visit: Payer: Self-pay | Admitting: Endocrinology

## 2014-10-31 ENCOUNTER — Telehealth: Payer: Self-pay | Admitting: Endocrinology

## 2014-10-31 LAB — LIPID PANEL
CHOLESTEROL: 213 mg/dL — AB (ref 0–200)
HDL: 41 mg/dL (ref 35–70)
LDL CALC: 148 mg/dL
TRIGLYCERIDES: 122 mg/dL (ref 40–160)

## 2014-10-31 LAB — TSH: TSH: 3.93 u[IU]/mL (ref 0.41–5.90)

## 2014-10-31 NOTE — Telephone Encounter (Signed)
I left a message on patients vm, the fax number left is not correct, I need the correct one to fax the orders

## 2014-10-31 NOTE — Telephone Encounter (Signed)
Patient would like his lab order sent [facility] Fax #  [phone]

## 2014-10-31 NOTE — Telephone Encounter (Signed)
Fax # number to [facility] [phone]

## 2014-11-09 ENCOUNTER — Ambulatory Visit (INDEPENDENT_AMBULATORY_CARE_PROVIDER_SITE_OTHER): Payer: 59 | Admitting: Endocrinology

## 2014-11-09 ENCOUNTER — Encounter: Payer: Self-pay | Admitting: Endocrinology

## 2014-11-09 ENCOUNTER — Other Ambulatory Visit: Payer: Self-pay | Admitting: *Deleted

## 2014-11-09 ENCOUNTER — Encounter: Payer: Self-pay | Admitting: *Deleted

## 2014-11-09 VITALS — BP 128/78 | HR 77 | Temp 98.1°F | Resp 16 | Ht 68.5 in | Wt 180.4 lb

## 2014-11-09 DIAGNOSIS — E038 Other specified hypothyroidism: Secondary | ICD-10-CM

## 2014-11-09 NOTE — Patient Instructions (Signed)
Take extra 1/2 pill on Sundays 

## 2014-11-09 NOTE — Progress Notes (Signed)
Patient ID: Bruce [name], male   DOB: 1970-10-20, 44 y.o.   MRN: [id number]  Reason for Appointment:  Hypothyroidism, followup visit    History of Present Illness:   The hypothyroidism was first diagnosed in 2012  Initially had only nonspecific fatigue and some cold intolerance.  No details are available about the initial diagnosis but his TSH in 2012 was 5.3  The patient has been treated with 100 mcg daily The last visit was in 4/15   Patient has no complaints of unusual fatigue, occasionally may have cold sensitivity, no recent problems with dry skin, unusual weight gain  The patient is taking the thyroid supplement very regularly in the morning before breakfast. Not taking any calcium or iron supplements with the thyroid supplement.    On the last visit the TSH was 2.1 and the dose was continued unchanged He is now taking generic levothyroxine instead of brand name because of difference in cost being excessive. He has been taking medication from [facility] but now is going to switch to [facility]  His TSH recently is 3.9  No visits with results within 1 Week(s) from this visit. Latest known visit with results is:  Appointment on 06/24/2013  Component Date Value Ref Range Status  . TSH 06/24/2013 2.06  0.35 - 5.50 uIU/mL Final  . Free T4 06/24/2013 1.01  0.60 - 1.60 ng/dL Final      Medication List       This list is accurate as of: 11/09/14  9:16 AM.  Always use your most recent med list.               COMPLETE PROTEIN/VITAMIN SHAKE PO  Take by mouth.     levothyroxine 100 MCG tablet  Commonly known as:  SYNTHROID, LEVOTHROID  Take 1 tablet daily, please dispense generic     multivitamin tablet  Take 1 tablet by mouth daily.        Allergies: No Known Allergies  Past Medical History  Diagnosis Date  . Hypothyroidism     Managed by endo ([facility])  . History of influenza 2012    influenza A + ([facility])  . Arthropathy of shoulder region  2013    left; s/p steroid injection x 2 by orthopedist; next step may be MRI and consideration of surgery per pt report (but 2nd inj has helped).    Past Surgical History  Procedure Laterality Date  . Sleep study  01/2012    Home sleep study via [employer] [facility]    Family History  Problem Relation Age of Onset  . Hypertension Mother   . Prostate cancer Father   . Cancer Father 33    prostate  . Thyroid disease Neg Hx     Social History:  reports that he has never smoked. He has never used smokeless tobacco. He reports that he drinks about 0.6 oz of alcohol per week. He reports that he does not use illicit drugs.  REVIEW Of SYSTEMS:    Wt Readings from Last 3 Encounters:  11/09/14 180 lb 6.4 oz (81.829 kg)  06/29/13 185 lb 12.8 oz (84.278 kg)  07/08/12 189 lb (85.73 kg)    Previous LDL was 163, followed by PCP   Examination:   BP 128/78 mmHg  Pulse 77  Temp(Src) 98.1 F (36.7 C)  Resp 16  Ht 5' 8.5" (1.74 m)  Wt 180 lb 6.4 oz (81.829 kg)  BMI 27.03 kg/m2  SpO2 95%  GENERAL APPEARANCE: Alert, no puffiness  of the face or eyes  NECK: Thyroid is not palpable           NEUROLOGIC EXAM:  biceps reflexes show normal relaxation, difficulty with illicit    Assessment   Hypothyroidism, primary and mild His TSH is high normal and this may be from switching to generic levothyroxine He is not symptomatic at this time   Treatment:   Continue same dosage before breakfast daily except extra half tablet once a week.  He will try to get the same manufacturer as much as possible for his levothyroxine Follow-up in 6 months  Tylia Ewell 11/09/2014, 9:16 AM

## 2014-11-14 ENCOUNTER — Telehealth: Payer: Self-pay | Admitting: Endocrinology

## 2014-11-14 ENCOUNTER — Other Ambulatory Visit: Payer: Self-pay | Admitting: *Deleted

## 2014-11-14 MED ORDER — LEVOTHYROXINE SODIUM 100 MCG PO TABS: ORAL_TABLET | ORAL | Status: DC

## 2014-11-14 NOTE — Telephone Encounter (Signed)
Team health note dated 11/11/14 11:46 AM Chief Complaint Prescription Refill or Medication Request (non symptomatic) Initial Comment Caller states he was supposed to have an Rx called in. he is completely out. Nurse Assessment Nurse: Laural Benes, RN, Dondra Spry Date/Time Lamount Cohen Time): 11/11/2014 11:46:53 AM Confirm and document reason for call. If symptomatic, describe symptoms. ---Jasmine December is out of his levothyroxiin po daily [facility] [place] [phone] Has the patient traveled out of the country within the last 30 days? ---No Does the patient require triage? ---No Please document clinical information provided and list any resource used. ---Nurse will call in maintenance of 5 days to insure office can call in the remaining dose BEFORE weekend is up when he will need the remainder. Guidelines Guideline Title Affirmed Question Affirmed Notes Nurse Date/Time (Eastern Time) Disp. Time Lamount Cohen Time) Disposition Final User 11/11/2014 10:57:39 AM Attempt made - message left Liberty Handy 11/11/2014 12:12:01 PM Pharmacy Call Laural Benes, RN, Dondra Spry Reason: called [facility] and gave RX for maintenance medication 11/11/2014 12:12:18 PM Clinical Call Yes Laural Benes, RN, Dondra Spry After Care Instructions Given Call Event Type User Date / Time Description Verbal Orders/Maintenance Medications Medication Refill Route Dosage Regime Duration Admin Instructions User Name Levothyroxin 1 tablet po daily x 5 days # 5 NR Yes Oral 1 tablet daily 5 Days Laural Benes, RN, Dondra Spry  Date Lamount Cohen Time): 11/11/2014 9:38:15 AM Triage RN: Fabienne Bruns, RN NAME: [name] PHONE NUMBER: [phone] (Primary) BIRTHDATE: 1970-04-10 ADDRESS: CITY/STATE/ZIP: [place] CALLER: Self NAME: Rx Given Medication Refill Route Dosage Regime Duration Admin Instructions Levothyroxin 1 tablet po daily x 5 days # 5 NR Yes Oral 1 tablet daily 5 Days

## 2014-11-14 NOTE — Telephone Encounter (Signed)
rx sent for a 90 day supply with 1 refill

## 2015-05-08 ENCOUNTER — Other Ambulatory Visit (INDEPENDENT_AMBULATORY_CARE_PROVIDER_SITE_OTHER): Payer: Commercial Managed Care - PPO

## 2015-05-08 DIAGNOSIS — E039 Hypothyroidism, unspecified: Secondary | ICD-10-CM | POA: Diagnosis not present

## 2015-05-08 DIAGNOSIS — E038 Other specified hypothyroidism: Secondary | ICD-10-CM

## 2015-05-08 LAB — T4, FREE: FREE T4: 1.17 ng/dL (ref 0.60–1.60)

## 2015-05-08 LAB — TSH: TSH: 1.71 u[IU]/mL (ref 0.35–4.50)

## 2015-05-14 ENCOUNTER — Other Ambulatory Visit: Payer: Self-pay | Admitting: *Deleted

## 2015-05-14 ENCOUNTER — Encounter: Payer: Self-pay | Admitting: Endocrinology

## 2015-05-14 ENCOUNTER — Ambulatory Visit (INDEPENDENT_AMBULATORY_CARE_PROVIDER_SITE_OTHER): Payer: Commercial Managed Care - PPO | Admitting: Endocrinology

## 2015-05-14 VITALS — BP 122/74 | HR 77 | Temp 98.2°F | Resp 14 | Ht 68.5 in | Wt 186.4 lb

## 2015-05-14 DIAGNOSIS — E038 Other specified hypothyroidism: Secondary | ICD-10-CM | POA: Diagnosis not present

## 2015-05-14 DIAGNOSIS — E063 Autoimmune thyroiditis: Secondary | ICD-10-CM

## 2015-05-14 MED ORDER — LEVOTHYROXINE SODIUM 100 MCG PO TABS: ORAL_TABLET | ORAL | Status: DC

## 2015-05-14 NOTE — Progress Notes (Signed)
Patient ID: Bruce SeltzerSharan [name], male   DOB: 06-Aug-1970, 45 y.o.   MRN: [id number]  Reason for Appointment:  Hypothyroidism, followup visit    History of Present Illness:   The hypothyroidism was first diagnosed in 2012  Initially had significant nonspecific fatigue and some cold intolerance.  No details are available about the initial diagnosis but his TSH in 2012 was 5.3  The patient has been treated with 100 mcg daily He was told to take A half tablet weekly because of his TSH being 3.9 on his last visit He says since he did not feel any better he not doing this now   Patient has no complaints of unusual fatigue, occasionally may have cold sensitivity, no recent problems with dry skin, unusual weight gain  The patient is taking the thyroid supplement very regularly in the morning before breakfast.   He is  taking generic levothyroxine instead of brand name because of difference in cost being excessive. He has been taking medication from  [facility]  His TSH is now improved   Lab on 05/08/2015  Component Date Value Ref Range Status  . TSH 05/08/2015 1.71  0.35 - 4.50 uIU/mL Final  . Free T4 05/08/2015 1.17  0.60 - 1.60 ng/dL Final      Medication List       This list is accurate as of: 05/14/15 10:09 AM.  Always use your most recent med list.               azelastine 0.1 % nasal spray  Commonly known as:  ASTELIN  INHALE 2 SPRAYS EACH NOSTRIL TWICE A DAY AS NEEDED     COMPLETE PROTEIN/VITAMIN SHAKE PO  Take by mouth.     levothyroxine 100 MCG tablet  Commonly known as:  SYNTHROID, LEVOTHROID  Take 1 tablet daily, please dispense generic     multivitamin tablet  Take 1 tablet by mouth daily.        Allergies: No Known Allergies  Past Medical History  Diagnosis Date  . Hypothyroidism     Managed by endo ([facility])  . History of influenza 2012    influenza A + ([facility])  . 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    Past Surgical History  Procedure Laterality Date  . Sleep study  01/2012    Home sleep study via [employer] [facility]    Family History  Problem Relation Age of Onset  . Hypertension Mother   . Prostate cancer Father   . Cancer Father [phone]    prostate  . Thyroid disease Neg Hx     Social History:  reports that he has never smoked. He has never used smokeless tobacco. He reports that he drinks about 0.6 oz of alcohol per week. He reports that he does not use illicit drugs.  REVIEW Of SYSTEMS:  Has had some weight gain again   Wt Readings from Last 3 Encounters:  05/14/15 186 lb 6.4 oz (84.55 kg)  11/09/14 180 lb 6.4 oz (81.829 kg)  06/29/13 185 lb 12.8 oz (84.278 kg)    Previous LDL was 163, He is going to establish with a new PCP   Examination:   BP 122/74 mmHg  Pulse 77  Temp(Src) 98.2 F (36.8 C)  Resp 14  Ht 5' 8.5" (1.74 m)  Wt 186 lb 6.4  oz (84.55 kg)  BMI 27.93 kg/m2  SpO2 96%    NECK: Thyroid is not palpable             Assessment   Hypothyroidism, primary and stable He was told to increase his dosage by half tablet weekly but is not doing so  His TSH is  normal and this may be from switching to a different manufacturer of his generic levothyroxine He is not symptomatic at this time   Treatment:   Continue same dosage before breakfast daily  He will try to get the same manufacturer as much as possible for his levothyroxine Follow-up in 12 months  Bruce [name] 05/14/2015, 10:09 AM

## 2015-06-13 ENCOUNTER — Telehealth: Payer: Self-pay | Admitting: Family Medicine

## 2015-06-13 NOTE — Telephone Encounter (Signed)
Caller name:[name], Bruce Relation to ZO:XWRUpt:self Call back [phone]   Reason for call:  Patient would like to establish with Dr. Abner GreenspanBlyth states he's wife is a patient of yours [name],[name].  Please advise

## 2015-06-13 NOTE — Telephone Encounter (Signed)
Yes OK to accept patient let me know if they need anything urgently

## 2015-06-14 NOTE — Telephone Encounter (Signed)
Patient feels like he has an hernia.

## 2015-06-14 NOTE — Telephone Encounter (Signed)
Patient scheduled an appointment for ? Hernia for 06/15/15 at 10am and a new patient appointment in August. Thank you

## 2015-06-15 ENCOUNTER — Ambulatory Visit (HOSPITAL_BASED_OUTPATIENT_CLINIC_OR_DEPARTMENT_OTHER)
Admission: RE | Admit: 2015-06-15 | Discharge: 2015-06-15 | Disposition: A | Discharge status: Home / Self Care | Payer: Commercial Managed Care - PPO | Source: Ambulatory Visit | Attending: Family Medicine | Admitting: Family Medicine

## 2015-06-15 ENCOUNTER — Encounter: Payer: Self-pay | Admitting: Family Medicine

## 2015-06-15 ENCOUNTER — Ambulatory Visit (INDEPENDENT_AMBULATORY_CARE_PROVIDER_SITE_OTHER): Payer: Commercial Managed Care - PPO | Admitting: Family Medicine

## 2015-06-15 VITALS — BP 122/90 | HR 76 | Temp 98.3°F | Ht 68.5 in | Wt 184.2 lb

## 2015-06-15 DIAGNOSIS — J9811 Atelectasis: Secondary | ICD-10-CM | POA: Diagnosis not present

## 2015-06-15 DIAGNOSIS — R05 Cough: Secondary | ICD-10-CM

## 2015-06-15 DIAGNOSIS — Z9109 Other allergy status, other than to drugs and biological substances: Secondary | ICD-10-CM

## 2015-06-15 DIAGNOSIS — Z0000 Encounter for general adult medical examination without abnormal findings: Secondary | ICD-10-CM

## 2015-06-15 DIAGNOSIS — K4090 Unilateral inguinal hernia, without obstruction or gangrene, not specified as recurrent: Secondary | ICD-10-CM

## 2015-06-15 DIAGNOSIS — E038 Other specified hypothyroidism: Secondary | ICD-10-CM

## 2015-06-15 DIAGNOSIS — Z91048 Other nonmedicinal substance allergy status: Secondary | ICD-10-CM

## 2015-06-15 DIAGNOSIS — R059 Cough, unspecified: Secondary | ICD-10-CM

## 2015-06-15 DIAGNOSIS — T7840XS Allergy, unspecified, sequela: Secondary | ICD-10-CM

## 2015-06-15 DIAGNOSIS — E7800 Pure hypercholesterolemia, unspecified: Secondary | ICD-10-CM

## 2015-06-15 LAB — COMPREHENSIVE METABOLIC PANEL
ALK PHOS: 69 U/L (ref 39–117)
ALT: 18 U/L (ref 0–53)
AST: 18 U/L (ref 0–37)
Albumin: 4.6 g/dL (ref 3.5–5.2)
BILIRUBIN TOTAL: 0.5 mg/dL (ref 0.2–1.2)
BUN: 15 mg/dL (ref 6–23)
CALCIUM: 9.7 mg/dL (ref 8.4–10.5)
CO2: 30 mEq/L (ref 19–32)
Chloride: 102 mEq/L (ref 96–112)
Creatinine, Ser: 0.98 mg/dL (ref 0.40–1.50)
GFR: 87.28 mL/min (ref >60.00)
Glucose, Bld: 87 mg/dL (ref 70–99)
POTASSIUM: 4.2 meq/L (ref 3.5–5.1)
Sodium: 138 mEq/L (ref 135–145)
TOTAL PROTEIN: 7.8 g/dL (ref 6.0–8.3)

## 2015-06-15 LAB — CBC WITH DIFFERENTIAL/PLATELET
BASOS ABS: 0 10*3/uL (ref 0.0–0.1)
Basophils Relative: 0.6 % (ref 0.0–3.0)
EOS ABS: 0.1 10*3/uL (ref 0.0–0.7)
Eosinophils Relative: 1.2 % (ref 0.0–5.0)
HEMATOCRIT: 49.4 % (ref 39.0–52.0)
HEMOGLOBIN: 16.8 g/dL (ref 13.0–17.0)
LYMPHS PCT: 41.8 % (ref 12.0–46.0)
Lymphs Abs: 3.2 10*3/uL (ref 0.7–4.0)
MCHC: 34 g/dL (ref 30.0–36.0)
MCV: 92.1 fl (ref 78.0–100.0)
MONOS PCT: 11.5 % (ref 3.0–12.0)
Monocytes Absolute: 0.9 10*3/uL (ref 0.1–1.0)
Neutro Abs: 3.5 10*3/uL (ref 1.4–7.7)
Neutrophils Relative %: 44.9 % (ref 43.0–77.0)
PLATELETS: 231 10*3/uL (ref 150.0–400.0)
RBC: 5.36 Mil/uL (ref 4.22–5.81)
RDW: 13.4 % (ref 11.5–15.5)
WBC: 7.7 10*3/uL (ref 4.0–10.5)

## 2015-06-15 LAB — TSH: TSH: 1.85 u[IU]/mL (ref 0.35–4.50)

## 2015-06-15 MED ORDER — MONTELUKAST SODIUM 10 MG PO TABS: 10.0000 mg | ORAL_TABLET | Freq: Every day | ORAL | Status: DC

## 2015-06-15 MED ORDER — RANITIDINE HCL 300 MG PO TABS
150.0000 mg | ORAL_TABLET | Freq: Every day | ORAL | Status: DC
Start: 2015-06-15 — End: 2015-10-26

## 2015-06-15 MED ORDER — FLUTICASONE PROPIONATE 50 MCG/ACT NA SUSP: 2.0000 | Freq: Every day | NASAL | Status: DC

## 2015-06-15 NOTE — Patient Instructions (Addendum)
NOW Probiotic Vitamin. Available at [facility] downstairs.  Inguinal Hernia, Adult Muscles help keep everything in the body in its proper place. But if a weak spot in the muscles develops, something can poke through. That is called a hernia. When this happens in the lower part of the belly (abdomen), it is called an inguinal hernia. (It takes its name from a part of the body in this region called the inguinal canal.) A weak spot in the wall of muscles lets some fat or part of the small intestine bulge through. An inguinal hernia can develop at any age. Men get them more often than women. CAUSES  In adults, an inguinal hernia develops over time.  It can be triggered by:  Suddenly straining the muscles of the lower abdomen.  Lifting heavy objects.  Straining to have a bowel movement. Difficult bowel movements (constipation) can lead to this.  Constant coughing. This may be caused by smoking or lung disease.  Being overweight.  Being pregnant.  Working at a job that requires long periods of standing or heavy lifting.  Having had an inguinal hernia before. One type can be an emergency situation. It is called a strangulated inguinal hernia. It develops if part of the small intestine slips through the weak spot and cannot get back into the abdomen. The blood supply can be cut off. If that happens, part of the intestine may die. This situation requires emergency surgery. SYMPTOMS  Often, a small inguinal hernia has no symptoms. It is found when a healthcare provider does a physical exam. Larger hernias usually have symptoms.   In adults, symptoms may include:  A lump in the groin. This is easier to see when the person is standing. It might disappear when lying down.  In men, a lump in the scrotum.  Pain or burning in the groin. This occurs especially when lifting, straining or coughing.  A dull ache or feeling of pressure in the groin.  Signs of a strangulated hernia can  include:  A bulge in the groin that becomes very painful and tender to the touch.  A bulge that turns red or purple.  Fever, nausea and vomiting.  Inability to have a bowel movement or to pass gas. DIAGNOSIS  To decide if you have an inguinal hernia, a healthcare provider will probably do a physical examination.  This will include asking questions about any symptoms you have noticed.  The healthcare provider might feel the groin area and ask you to cough. If an inguinal hernia is felt, the healthcare provider may try to slide it back into the abdomen.  Usually no other tests are needed. TREATMENT  Treatments can vary. The size of the hernia makes a difference. Options include:  Watchful waiting. This is often suggested if the hernia is small and you have had no symptoms.  No medical procedure will be done unless symptoms develop.  You will need to watch closely for symptoms. If any occur, contact your healthcare provider right away.  Surgery. This is used if the hernia is larger or you have symptoms.  Open surgery. This is usually an outpatient procedure (you will not stay overnight in a hospital). An cut (incision) is made through the skin in the groin. The hernia is put back inside the abdomen. The weak area in the muscles is then repaired by herniorrhaphy or hernioplasty. Herniorrhaphy: in this type of surgery, the weak muscles are sewn back together. Hernioplasty: a patch or mesh is used to close  the weak area in the abdominal wall.  Laparoscopy. In this procedure, a surgeon makes small incisions. A thin tube with a tiny video camera (called a laparoscope) is put into the abdomen. The surgeon repairs the hernia with mesh by looking with the video camera and using two long instruments. HOME CARE INSTRUCTIONS   After surgery to repair an inguinal hernia:  You will need to take pain medicine prescribed by your healthcare provider. Follow all directions carefully.  You will need  to take care of the wound from the incision.  Your activity will be restricted for awhile. This will probably include no heavy lifting for several weeks. You also should not do anything too active for a few weeks. When you can return to work will depend on the type of job that you have.  During "watchful waiting" periods, you should:  Maintain a healthy weight.  Eat a diet high in fiber (fruits, vegetables and whole grains).  Drink plenty of fluids to avoid constipation. This means drinking enough water and other liquids to keep your urine clear or pale yellow.  Do not lift heavy objects.  Do not stand for long periods of time.  Quit smoking. This should keep you from developing a frequent cough. SEEK MEDICAL CARE IF:   A bulge develops in your groin area.  You feel pain, a burning sensation or pressure in the groin. This might be worse if you are lifting or straining.  You develop a fever of more than 100.5 F (38.1 C). SEEK IMMEDIATE MEDICAL CARE IF:   Pain in the groin increases suddenly.  A bulge in the groin gets bigger suddenly and does not go down.  For men, there is sudden pain in the scrotum. Or, the size of the scrotum increases.  A bulge in the groin area becomes red or purple and is painful to touch.  You have nausea or vomiting that does not go away.  You feel your heart beating much faster than normal.  You cannot have a bowel movement or pass gas.  You develop a fever of more than 102.0 F (38.9 C).   This information is not intended to replace advice given to you by your health care provider. Make sure you discuss any questions you have with your health care provider.   Document Released: 07/13/2008 Document Revised: 05/19/2011 Document Reviewed: 08/28/2014 Elsevier Interactive Patient Education [employer]

## 2015-06-15 NOTE — Progress Notes (Signed)
Subjective:    Patient ID: Bruce SeltzerSharan [name], male    DOB: 30-Jun-1970, 45 y.o.   MRN: [id number]  Chief Complaint  Patient presents with  . Hernia  . Cough    HPI Patient is in today for concerns bout right ingunial hernia.  Patient also reports a dry chronic cough and patient also reports some snoring and wife reports he stops breathing during night.  Patient has had a sleep study 4 years ago and it was normal.  Denies CP/palp/SOB/HA/congestion/fevers/GI or GU c/o. Taking meds as prescribed.    Past Medical History  Diagnosis Date  . Hypothyroidism     Managed by endo ([facility])  . History of influenza 2012    influenza A + ([facility])  . 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  . Allergic state 06/24/2015  . Inguinal hernia 06/24/2015    Past Surgical History  Procedure Laterality Date  . Sleep study  01/2012    Home sleep study via [employer] NORMAL    Family History  Problem Relation Age of Onset  . Hypertension Mother   . Prostate cancer Father   . Cancer Father [id number]    prostate  . Thyroid disease Neg Hx     Social History   Social History  . Marital Status: Married    Spouse Name: N/A  . Number of Children: N/A  . Years of Education: N/A   Occupational History  . Not on file.   Social History Main Topics  . Smoking status: Never Smoker   . Smokeless tobacco: Never Used  . Alcohol Use: 0.6 oz/week    1 Shots of liquor per week     Comment: occasionally  . Drug Use: No  . Sexual Activity: Yes   Other Topics Concern  . Not on file   Social History Narrative   Married, 2 children (ages 198 and 2).   Master's degree in [place]  Emergency planning/management officerroject manager for [employer]   No tobacco, rare alcohol, no drug use.   Exercises regularly.  Typical american diet except avoids red meat.     Outpatient Prescriptions Prior  to Visit  Medication Sig Dispense Refill  . levothyroxine (SYNTHROID, LEVOTHROID) 100 MCG tablet Take 1 tablet daily, please dispense generic 90 tablet 1  . azelastine (ASTELIN) 0.1 % nasal spray Reported on 06/15/2015  5  . Multiple Vitamin (MULTIVITAMIN) tablet Take 1 tablet by mouth daily. Reported on 06/15/2015    . Nutritional Supplements (COMPLETE PROTEIN/VITAMIN SHAKE PO) Take by mouth.     No facility-administered medications prior to visit.    No Known Allergies  Review of Systems  Constitutional: Negative for fever and malaise/fatigue.  Eyes: Negative for blurred vision.  Respiratory: Positive for cough. Negative for shortness of breath.   Cardiovascular: Negative for chest pain, palpitations and leg swelling.  Gastrointestinal: Negative for nausea, abdominal pain and blood in stool.  Genitourinary: Negative for dysuria and frequency.  Musculoskeletal: Negative for falls.  Skin: Negative for rash.  Neurological: Negative for dizziness, loss of consciousness and headaches.  Endo/Heme/Allergies: Negative for environmental allergies.  Psychiatric/Behavioral: Negative for depression. The patient is not nervous/anxious.        Objective:    Physical Exam  Constitutional: He is oriented to person, place, and time. He appears well-developed and well-nourished. No distress.  HENT:  Head: Normocephalic and atraumatic.  TMs dull and cloudy, nasal mucosa boggy and erythematous.   Eyes: Conjunctivae are normal.  Neck: Neck supple. No thyromegaly present.  Cardiovascular: Normal rate, regular rhythm and normal heart sounds.   No murmur heard. Pulmonary/Chest: Effort normal and breath sounds normal. No respiratory distress. He has no wheezes.  Abdominal: Soft. Bowel sounds are normal. He exhibits no mass. There is no tenderness.  Musculoskeletal: He exhibits no edema.  Lymphadenopathy:    He has no cervical adenopathy.  Neurological: He is alert and oriented to person, place, and  time.  Skin: Skin is warm and dry.  Psychiatric: He has a normal mood and affect. His behavior is normal.    BP 122/90 mmHg  Pulse 76  Temp(Src) 98.3 F (36.8 C) (Oral)  Ht 5' 8.5" (1.74 m)  Wt 184 lb 4 oz (83.575 kg)  BMI 27.60 kg/m2  SpO2 98% Wt Readings from Last 3 Encounters:  06/15/15 184 lb 4 oz (83.575 kg)  05/14/15 186 lb 6.4 oz (84.55 kg)  11/09/14 180 lb 6.4 oz (81.829 kg)     Lab Results  Component Value Date   WBC 7.7 06/15/2015   HGB 16.8 06/15/2015   HCT 49.4 06/15/2015   PLT 231.0 06/15/2015   GLUCOSE 87 06/15/2015   CHOL 213* 10/31/2014   TRIG 122 10/31/2014   HDL 41 10/31/2014   LDLDIRECT 147.2 11/22/2010   LDLCALC 148 10/31/2014   ALT 18 06/15/2015   AST 18 06/15/2015   NA 138 06/15/2015   K 4.2 06/15/2015   CL 102 06/15/2015   CREATININE 0.98 06/15/2015   BUN 15 06/15/2015   CO2 30 06/15/2015   TSH 1.85 06/15/2015   PSA 0.63 11/22/2010    Lab Results  Component Value Date   TSH 1.85 06/15/2015   Lab Results  Component Value Date   WBC 7.7 06/15/2015   HGB 16.8 06/15/2015   HCT 49.4 06/15/2015   MCV 92.1 06/15/2015   PLT 231.0 06/15/2015   Lab Results  Component Value Date   NA 138 06/15/2015   K 4.2 06/15/2015   CO2 30 06/15/2015   GLUCOSE 87 06/15/2015   BUN 15 06/15/2015   CREATININE 0.98 06/15/2015   BILITOT 0.5 06/15/2015   ALKPHOS 69 06/15/2015   AST 18 06/15/2015   ALT 18 06/15/2015   PROT 7.8 06/15/2015   ALBUMIN 4.6 06/15/2015   CALCIUM 9.7 06/15/2015   GFR 87.28 06/15/2015   Lab Results  Component Value Date   CHOL 213* 10/31/2014   Lab Results  Component Value Date   HDL 41 10/31/2014   Lab Results  Component Value Date   LDLCALC 148 10/31/2014   Lab Results  Component Value Date   TRIG 122 10/31/2014   Lab Results  Component Value Date   CHOLHDL 6 11/22/2010   No results found for: HGBA1C     Assessment & Plan:   Problem List Items Addressed This Visit    RESOLVED: Adult onset  hypothyroidism   Relevant Medications   fluticasone (FLONASE) 50 MCG/ACT nasal spray   montelukast (SINGULAIR) 10 MG tablet   ranitidine (ZANTAC) 300 MG tablet   Other Relevant Orders   DG Chest 2 View (Completed)   Comprehensive metabolic panel (Completed)   TSH (Completed)   CBC with Differential/Platelet (Completed)   CBC   Lipid panel   TSH   Comprehensive metabolic panel   Allergic state    Worsening symptoms, singulair, Flonase and antihistamines. Referred  to ENT for possible allergy testing.       Cough - Primary    Likely multifactorial treat allergies and reflux and report if does not improve.      Relevant Medications   fluticasone (FLONASE) 50 MCG/ACT nasal spray   montelukast (SINGULAIR) 10 MG tablet   ranitidine (ZANTAC) 300 MG tablet   Other Relevant Orders   DG Chest 2 View (Completed)   Comprehensive metabolic panel (Completed)   TSH (Completed)   CBC with Differential/Platelet (Completed)   Ambulatory referral to ENT   CBC   Lipid panel   TSH   Comprehensive metabolic panel   Inguinal hernia    Mild discomfort noted in right inguinal area with coughing      Pure hypercholesterolemia   Relevant Medications   fluticasone (FLONASE) 50 MCG/ACT nasal spray   montelukast (SINGULAIR) 10 MG tablet   ranitidine (ZANTAC) 300 MG tablet   Other Relevant Orders   DG Chest 2 View (Completed)   Comprehensive metabolic panel (Completed)   TSH (Completed)   CBC with Differential/Platelet (Completed)   CBC   Lipid panel   TSH   Comprehensive metabolic panel   Routine general medical examination at a health care facility   Relevant Medications   fluticasone (FLONASE) 50 MCG/ACT nasal spray   montelukast (SINGULAIR) 10 MG tablet   ranitidine (ZANTAC) 300 MG tablet   Other Relevant Orders   DG Chest 2 View (Completed)   Comprehensive metabolic panel (Completed)   TSH (Completed)   CBC with Differential/Platelet (Completed)   CBC   Lipid panel   TSH    Comprehensive metabolic panel    Other Visit Diagnoses    Environmental allergies        Relevant Orders    Ambulatory referral to ENT    CBC    Lipid panel    TSH    Comprehensive metabolic panel       I have discontinued Mr. Buckles Nutritional Supplements (COMPLETE PROTEIN/VITAMIN SHAKE PO). I am also having him start on fluticasone, montelukast, and ranitidine. Additionally, I am having him maintain his multivitamin, azelastine, and levothyroxine.  Meds ordered this encounter  Medications  . fluticasone (FLONASE) 50 MCG/ACT nasal spray    Sig: Place 2 sprays into both nostrils daily.    Dispense:  16 g    Refill:  6  . montelukast (SINGULAIR) 10 MG tablet    Sig: Take 1 tablet (10 mg total) by mouth at bedtime.    Dispense:  30 tablet    Refill:  3  . ranitidine (ZANTAC) 300 MG tablet    Sig: Take 0.5 tablets (150 mg total) by mouth at bedtime.    Dispense:  30 tablet    Refill:  3     Danise Edge, MD

## 2015-06-15 NOTE — Progress Notes (Signed)
Pre visit review using our clinic review tool, if applicable. No additional management support is needed unless otherwise documented below in the visit note. 

## 2015-06-24 ENCOUNTER — Encounter: Payer: Self-pay | Admitting: Family Medicine

## 2015-06-24 DIAGNOSIS — J3089 Other allergic rhinitis: Secondary | ICD-10-CM

## 2015-06-24 DIAGNOSIS — K4090 Unilateral inguinal hernia, without obstruction or gangrene, not specified as recurrent: Secondary | ICD-10-CM | POA: Insufficient documentation

## 2015-06-24 DIAGNOSIS — T7840XA Allergy, unspecified, initial encounter: Secondary | ICD-10-CM

## 2015-06-24 DIAGNOSIS — R0683 Snoring: Secondary | ICD-10-CM | POA: Insufficient documentation

## 2015-06-24 HISTORY — DX: Unilateral inguinal hernia, without obstruction or gangrene, not specified as recurrent: K40.90

## 2015-06-24 HISTORY — DX: Snoring: R06.83

## 2015-06-24 HISTORY — DX: Allergy, unspecified, initial encounter: T78.40XA

## 2015-06-24 HISTORY — DX: Other allergic rhinitis: J30.89

## 2015-06-24 NOTE — Assessment & Plan Note (Signed)
On Levothyroxine, continue to monitor 

## 2015-06-24 NOTE — Assessment & Plan Note (Addendum)
Worsening symptoms, singulair, Flonase and antihistamines. Referred to ENT for possible allergy testing.

## 2015-06-24 NOTE — Assessment & Plan Note (Signed)
Likely multifactorial treat allergies and reflux and report if does not improve.

## 2015-06-24 NOTE — Assessment & Plan Note (Signed)
Mild discomfort noted in right inguinal area with coughing

## 2015-10-18 ENCOUNTER — Other Ambulatory Visit (INDEPENDENT_AMBULATORY_CARE_PROVIDER_SITE_OTHER): Payer: Commercial Managed Care - PPO

## 2015-10-18 DIAGNOSIS — R05 Cough: Secondary | ICD-10-CM

## 2015-10-18 DIAGNOSIS — E7800 Pure hypercholesterolemia, unspecified: Secondary | ICD-10-CM

## 2015-10-18 DIAGNOSIS — Z0000 Encounter for general adult medical examination without abnormal findings: Secondary | ICD-10-CM | POA: Diagnosis not present

## 2015-10-18 DIAGNOSIS — Z9109 Other allergy status, other than to drugs and biological substances: Secondary | ICD-10-CM

## 2015-10-18 DIAGNOSIS — Z91048 Other nonmedicinal substance allergy status: Secondary | ICD-10-CM

## 2015-10-18 DIAGNOSIS — E038 Other specified hypothyroidism: Secondary | ICD-10-CM

## 2015-10-18 DIAGNOSIS — R059 Cough, unspecified: Secondary | ICD-10-CM

## 2015-10-18 LAB — COMPREHENSIVE METABOLIC PANEL
ALT: 15 U/L (ref 0–53)
AST: 15 U/L (ref 0–37)
Albumin: 4.3 g/dL (ref 3.5–5.2)
Alkaline Phosphatase: 57 U/L (ref 39–117)
BILIRUBIN TOTAL: 0.6 mg/dL (ref 0.2–1.2)
BUN: 13 mg/dL (ref 6–23)
CALCIUM: 9.3 mg/dL (ref 8.4–10.5)
CHLORIDE: 105 meq/L (ref 96–112)
CO2: 30 meq/L (ref 19–32)
Creatinine, Ser: 1.01 mg/dL (ref 0.40–1.50)
GFR: 84.17 mL/min (ref >60.00)
GLUCOSE: 102 mg/dL — AB (ref 70–99)
POTASSIUM: 4.5 meq/L (ref 3.5–5.1)
Sodium: 139 mEq/L (ref 135–145)
Total Protein: 7.3 g/dL (ref 6.0–8.3)

## 2015-10-18 LAB — LIPID PANEL
CHOL/HDL RATIO: 6
Cholesterol: 191 mg/dL (ref 0–200)
HDL: 34.7 mg/dL — AB (ref >39.00)
LDL Cholesterol: 142 mg/dL — ABNORMAL HIGH (ref 0–99)
NONHDL: 156.65
Triglycerides: 72 mg/dL (ref 0.0–149.0)
VLDL: 14.4 mg/dL (ref 0.0–40.0)

## 2015-10-18 LAB — CBC
HEMATOCRIT: 47.7 % (ref 39.0–52.0)
HEMOGLOBIN: 16.4 g/dL (ref 13.0–17.0)
MCHC: 34.3 g/dL (ref 30.0–36.0)
MCV: 92.1 fl (ref 78.0–100.0)
PLATELETS: 213 10*3/uL (ref 150.0–400.0)
RBC: 5.18 Mil/uL (ref 4.22–5.81)
RDW: 13 % (ref 11.5–15.5)
WBC: 6.9 10*3/uL (ref 4.0–10.5)

## 2015-10-18 LAB — TSH: TSH: 1.94 u[IU]/mL (ref 0.35–4.50)

## 2015-10-19 ENCOUNTER — Other Ambulatory Visit: Payer: Commercial Managed Care - PPO

## 2015-10-26 ENCOUNTER — Encounter: Payer: Self-pay | Admitting: Family Medicine

## 2015-10-26 ENCOUNTER — Ambulatory Visit (INDEPENDENT_AMBULATORY_CARE_PROVIDER_SITE_OTHER): Payer: Commercial Managed Care - PPO | Admitting: Family Medicine

## 2015-10-26 DIAGNOSIS — Z8639 Personal history of other endocrine, nutritional and metabolic disease: Secondary | ICD-10-CM

## 2015-10-26 DIAGNOSIS — Z87898 Personal history of other specified conditions: Secondary | ICD-10-CM

## 2015-10-26 DIAGNOSIS — J3089 Other allergic rhinitis: Secondary | ICD-10-CM

## 2015-10-26 DIAGNOSIS — K4090 Unilateral inguinal hernia, without obstruction or gangrene, not specified as recurrent: Secondary | ICD-10-CM | POA: Diagnosis not present

## 2015-10-26 DIAGNOSIS — Z0000 Encounter for general adult medical examination without abnormal findings: Secondary | ICD-10-CM

## 2015-10-26 DIAGNOSIS — R0683 Snoring: Secondary | ICD-10-CM

## 2015-10-26 DIAGNOSIS — Z0001 Encounter for general adult medical examination with abnormal findings: Secondary | ICD-10-CM | POA: Diagnosis not present

## 2015-10-26 DIAGNOSIS — E7800 Pure hypercholesterolemia, unspecified: Secondary | ICD-10-CM

## 2015-10-26 DIAGNOSIS — E039 Hypothyroidism, unspecified: Secondary | ICD-10-CM

## 2015-10-26 HISTORY — DX: Personal history of other specified conditions: Z87.898

## 2015-10-26 NOTE — Assessment & Plan Note (Signed)
Right sided, asymptomatic, improved with weight loss

## 2015-10-26 NOTE — Progress Notes (Signed)
Pre visit review using our clinic review tool, if applicable. No additional management support is needed unless otherwise documented below in the visit note. 

## 2015-10-26 NOTE — Assessment & Plan Note (Addendum)
Spring is the worst season and stress contributes to symptoms Consider NOW probiotics daily

## 2015-10-26 NOTE — Assessment & Plan Note (Signed)
Resolved cough but snoring has returned since stopping.

## 2015-10-26 NOTE — Patient Instructions (Addendum)
If cough returns restart meds, if cough resolves again, stop Ranitidine first wait 2-4 weeks then stop one allergy med at a time roughly every last medicine to stop is Montelukast/Singulair. Restart any med if cough returns upon stopping it.   Preventive Care for Adults, Male A healthy lifestyle and preventive care can promote health and wellness. Preventive health guidelines for men include the following key practices:  A routine yearly physical is a good way to check with your health care provider about your health and preventative screening. It is a chance to share any concerns and updates on your health and to receive a thorough exam.  Visit your dentist for a routine exam and preventative care every 6 months. Brush your teeth twice a day and floss once a day. Good oral hygiene prevents tooth decay and gum disease.  The frequency of eye exams is based on your age, health, family medical history, use of contact lenses, and other factors. Follow your health care provider's recommendations for frequency of eye exams.  Eat a healthy diet. Foods such as vegetables, fruits, whole grains, low-fat dairy products, and lean protein foods contain the nutrients you need without too many calories. Decrease your intake of foods high in solid fats, added sugars, and salt. Eat the right amount of calories for you.Get information about a proper diet from your health care provider, if necessary.  Regular physical exercise is one of the most important things you can do for your health. Most adults should get at least 150 minutes of moderate-intensity exercise (any activity that increases your heart rate and causes you to sweat) each week. In addition, most adults need muscle-strengthening exercises on 2 or more days a week.  Maintain a healthy weight. The body mass index (BMI) is a screening tool to identify possible weight problems. It provides an estimate of body fat based on height and weight. Your health care  provider can find your BMI and can help you achieve or maintain a healthy weight.For adults 20 years and older:  A BMI below 18.5 is considered underweight.  A BMI of 18.5 to 24.9 is normal.  A BMI of 25 to 29.9 is considered overweight.  A BMI of 30 and above is considered obese.  Maintain normal blood lipids and cholesterol levels by exercising and minimizing your intake of saturated fat. Eat a balanced diet with plenty of fruit and vegetables. Blood tests for lipids and cholesterol should begin at age 76 and be repeated every 5 years. If your lipid or cholesterol levels are high, you are over 50, or you are at high risk for heart disease, you may need your cholesterol levels checked more frequently.Ongoing high lipid and cholesterol levels should be treated with medicines if diet and exercise are not working.  If you smoke, find out from your health care provider how to quit. If you do not use tobacco, do not start.  Lung cancer screening is recommended for adults aged 55-80 years who are at high risk for developing lung cancer because of a history of smoking. A yearly low-dose CT scan of the lungs is recommended for people who have at least a 30-pack-year history of smoking and are a current smoker or have quit within the past 15 years. A pack year of smoking is smoking an average of 1 pack of cigarettes a day for 1 year (for example: 1 pack a day for 30 years or 2 packs a day for 15 years). Yearly screening should continue until  the smoker has stopped smoking for at least 15 years. Yearly screening should be stopped for people who develop a health problem that would prevent them from having lung cancer treatment.  If you choose to drink alcohol, do not have more than 2 drinks per day. One drink is considered to be 12 ounces (355 mL) of beer, 5 ounces (148 mL) of wine, or 1.5 ounces (44 mL) of liquor.  Avoid use of street drugs. Do not share needles with anyone. Ask for help if you need  support or instructions about stopping the use of drugs.  High blood pressure causes heart disease and increases the risk of stroke. Your blood pressure should be checked at least every 1-2 years. Ongoing high blood pressure should be treated with medicines, if weight loss and exercise are not effective.  If you are 98-58 years old, ask your health care provider if you should take aspirin to prevent heart disease.  Diabetes screening is done by taking a blood sample to check your blood glucose level after you have not eaten for a certain period of time (fasting). If you are not overweight and you do not have risk factors for diabetes, you should be screened once every 3 years starting at age 6. If you are overweight or obese and you are 59-50 years of age, you should be screened for diabetes every year as part of your cardiovascular risk assessment.  Colorectal cancer can be detected and often prevented. Most routine colorectal cancer screening begins at the age of 14 and continues through age 72. However, your health care provider may recommend screening at an earlier age if you have risk factors for colon cancer. On a yearly basis, your health care provider may provide home test kits to check for hidden blood in the stool. Use of a small camera at the end of a tube to directly examine the colon (sigmoidoscopy or colonoscopy) can detect the earliest forms of colorectal cancer. Talk to your health care provider about this at age 80, when routine screening begins. Direct exam of the colon should be repeated every 5-10 years through age 67, unless early forms of precancerous polyps or small growths are found.  People who are at an increased risk for hepatitis B should be screened for this virus. You are considered at high risk for hepatitis B if:  You were born in a country where hepatitis B occurs often. Talk with your health care provider about which countries are considered high risk.  Your parents  were born in a high-risk country and you have not received a shot to protect against hepatitis B (hepatitis B vaccine).  You have HIV or AIDS.  You use needles to inject street drugs.  You live with, or have sex with, someone who has hepatitis B.  You are a man who has sex with other men (MSM).  You get hemodialysis treatment.  You take certain medicines for conditions such as cancer, organ transplantation, and autoimmune conditions.  Hepatitis C blood testing is recommended for all people born from [birth year] through 1965 and any individual with known risks for hepatitis C.  Practice safe sex. Use condoms and avoid high-risk sexual practices to reduce the spread of sexually transmitted infections (STIs). STIs include gonorrhea, chlamydia, syphilis, trichomonas, herpes, HPV, and human immunodeficiency virus (HIV). Herpes, HIV, and HPV are viral illnesses that have no cure. They can result in disability, cancer, and death.  If you are a man who has sex with other  men, you should be screened at least once per year for:  HIV.  Urethral, rectal, and pharyngeal infection of gonorrhea, chlamydia, or both.  If you are at risk of being infected with HIV, it is recommended that you take a prescription medicine daily to prevent HIV infection. This is called preexposure prophylaxis (PrEP). You are considered at risk if:  You are a man who has sex with other men (MSM) and have other risk factors.  You are a heterosexual man, are sexually active, and are at increased risk for HIV infection.  You take drugs by injection.  You are sexually active with a partner who has HIV.  Talk with your health care provider about whether you are at high risk of being infected with HIV. If you choose to begin PrEP, you should first be tested for HIV. You should then be tested every 3 months for as long as you are taking PrEP.  A one-time screening for abdominal aortic aneurysm (AAA) and surgical repair of large AAAs  by ultrasound are recommended for men ages 59 to 21 years who are current or former smokers.  Healthy men should no longer receive prostate-specific antigen (PSA) blood tests as part of routine cancer screening. Talk with your health care provider about prostate cancer screening.  Testicular cancer screening is not recommended for adult males who have no symptoms. Screening includes self-exam, a health care provider exam, and other screening tests. Consult with your health care provider about any symptoms you have or any concerns you have about testicular cancer.  Use sunscreen. Apply sunscreen liberally and repeatedly throughout the day. You should seek shade when your shadow is shorter than you. Protect yourself by wearing long sleeves, pants, a wide-brimmed hat, and sunglasses year round, whenever you are outdoors.  Once a month, do a whole-body skin exam, using a mirror to look at the skin on your back. Tell your health care provider about new moles, moles that have irregular borders, moles that are larger than a pencil eraser, or moles that have changed in shape or color.  Stay current with required vaccines (immunizations).  Influenza vaccine. All adults should be immunized every year.  Tetanus, diphtheria, and acellular pertussis (Td, Tdap) vaccine. An adult who has not previously received Tdap or who does not know his vaccine status should receive 1 dose of Tdap. This initial dose should be followed by tetanus and diphtheria toxoids (Td) booster doses every 10 years. Adults with an unknown or incomplete history of completing a 3-dose immunization series with Td-containing vaccines should begin or complete a primary immunization series including a Tdap dose. Adults should receive a Td booster every 10 years.  Varicella vaccine. An adult without evidence of immunity to varicella should receive 2 doses or a second dose if he has previously received 1 dose.  Human papillomavirus (HPV) vaccine.  Males aged 11-21 years who have not received the vaccine previously should receive the 3-dose series. Males aged 22-26 years may be immunized. Immunization is recommended through the age of 48 years for any male who has sex with males and did not get any or all doses earlier. Immunization is recommended for any person with an immunocompromised condition through the age of 46 years if he did not get any or all doses earlier. During the 3-dose series, the second dose should be obtained 4-8 weeks after the first dose. The third dose should be obtained 24 weeks after the first dose and 16 weeks after the second dose.  Zoster vaccine. One dose is recommended for adults aged 39 years or older unless certain conditions are present.  Measles, mumps, and rubella (MMR) vaccine. Adults born before [birth year] generally are considered immune to measles and mumps. Adults born in [birth year] or later should have 1 or more doses of MMR vaccine unless there is a contraindication to the vaccine or there is laboratory evidence of immunity to each of the three diseases. A routine second dose of MMR vaccine should be obtained at least 28 days after the first dose for students attending postsecondary schools, health care workers, or international travelers. People who received inactivated measles vaccine or an unknown type of measles vaccine during 1963-1967 should receive 2 doses of MMR vaccine. People who received inactivated mumps vaccine or an unknown type of mumps vaccine before 1979 and are at high risk for mumps infection should consider immunization with 2 doses of MMR vaccine. Unvaccinated health care workers born before [birth year] who lack laboratory evidence of measles, mumps, or rubella immunity or laboratory confirmation of disease should consider measles and mumps immunization with 2 doses of MMR vaccine or rubella immunization with 1 dose of MMR vaccine.  Pneumococcal 13-valent conjugate (PCV13) vaccine. When indicated, a person who is  uncertain of his immunization history and has no record of immunization should receive the PCV13 vaccine. All adults 63 years of age and older should receive this vaccine. An adult aged 66 years or older who has certain medical conditions and has not been previously immunized should receive 1 dose of PCV13 vaccine. This PCV13 should be followed with a dose of pneumococcal polysaccharide (PPSV23) vaccine. Adults who are at high risk for pneumococcal disease should obtain the PPSV23 vaccine at least 8 weeks after the dose of PCV13 vaccine. Adults older than 47 years of age who have normal immune system function should obtain the PPSV23 vaccine dose at least 1 year after the dose of PCV13 vaccine.  Pneumococcal polysaccharide (PPSV23) vaccine. When PCV13 is also indicated, PCV13 should be obtained first. All adults aged 86 years and older should be immunized. An adult younger than age 41 years who has certain medical conditions should be immunized. Any person who resides in a nursing home or long-term care facility should be immunized. An adult smoker should be immunized. People with an immunocompromised condition and certain other conditions should receive both PCV13 and PPSV23 vaccines. People with human immunodeficiency virus (HIV) infection should be immunized as soon as possible after diagnosis. Immunization during chemotherapy or radiation therapy should be avoided. Routine use of PPSV23 vaccine is not recommended for American Indians, [place] Natives, or people younger than 65 years unless there are medical conditions that require PPSV23 vaccine. When indicated, people who have unknown immunization and have no record of immunization should receive PPSV23 vaccine. One-time revaccination 5 years after the first dose of PPSV23 is recommended for people aged 19-64 years who have chronic kidney failure, nephrotic syndrome, asplenia, or immunocompromised conditions. People who received 1-2 doses of PPSV23 before age  27 years should receive another dose of PPSV23 vaccine at age 11 years or later if at least 5 years have passed since the previous dose. Doses of PPSV23 are not needed for people immunized with PPSV23 at or after age 84 years.  Meningococcal vaccine. Adults with asplenia or persistent complement component deficiencies should receive 2 doses of quadrivalent meningococcal conjugate (MenACWY-D) vaccine. The doses should be obtained at least 2 months apart. Microbiologists working with certain meningococcal bacteria, [employer] recruits, people at  risk during an outbreak, and people who travel to or live in countries with a high rate of meningitis should be immunized. A first-year college student up through age 61 years who is living in a residence hall should receive a dose if he did not receive a dose on or after his 16th birthday. Adults who have certain high-risk conditions should receive one or more doses of vaccine.  Hepatitis A vaccine. Adults who wish to be protected from this disease, have chronic liver disease, work with hepatitis A-infected animals, work in hepatitis A research labs, or travel to or work in countries with a high rate of hepatitis A should be immunized. Adults who were previously unvaccinated and who anticipate close contact with an international adoptee during the first 60 days after arrival in the [place] from a country with a high rate of hepatitis A should be immunized.  Hepatitis B vaccine. Adults should be immunized if they wish to be protected from this disease, are under age 33 years and have diabetes, have chronic liver disease, have had more than one sex partner in the past 6 months, may be exposed to blood or other infectious body fluids, are household contacts or sex partners of hepatitis B positive people, are clients or workers in certain care facilities, or travel to or work in countries with a high rate of hepatitis B.  Haemophilus influenzae type b (Hib) vaccine. A  previously unvaccinated person with asplenia or sickle cell disease or having a scheduled splenectomy should receive 1 dose of Hib vaccine. Regardless of previous immunization, a recipient of a hematopoietic stem cell transplant should receive a 3-dose series 6-12 months after his successful transplant. Hib vaccine is not recommended for adults with HIV infection. Preventive Service / Frequency Ages 23 to 71  Blood pressure check.** / Every 3-5 years.  Lipid and cholesterol check.** / Every 5 years beginning at age 56.  Hepatitis C blood test.** / For any individual with known risks for hepatitis C.  Skin self-exam. / Monthly.  Influenza vaccine. / Every year.  Tetanus, diphtheria, and acellular pertussis (Tdap, Td) vaccine.** / Consult your health care provider. 1 dose of Td every 10 years.  Varicella vaccine.** / Consult your health care provider.  HPV vaccine. / 3 doses over 6 months, if 8 or younger.  Measles, mumps, rubella (MMR) vaccine.** / You need at least 1 dose of MMR if you were born in 1957 or later. You may also need a second dose.  Pneumococcal 13-valent conjugate (PCV13) vaccine.** / Consult your health care provider.  Pneumococcal polysaccharide (PPSV23) vaccine.** / 1 to 2 doses if you smoke cigarettes or if you have certain conditions.  Meningococcal vaccine.** / 1 dose if you are age 10 to 9 years and a Market researcher living in a residence hall, or have one of several medical conditions. You may also need additional booster doses.  Hepatitis A vaccine.** / Consult your health care provider.  Hepatitis B vaccine.** / Consult your health care provider.  Haemophilus influenzae type b (Hib) vaccine.** / Consult your health care provider. Ages 25 to 71  Blood pressure check.** / Every year.  Lipid and cholesterol check.** / Every 5 years beginning at age 19.  Lung cancer screening. / Every year if you are aged 59-80 years and have a 30-pack-year  history of smoking and currently smoke or have quit within the past 15 years. Yearly screening is stopped once you have quit smoking for at least 15  years or develop a health problem that would prevent you from having lung cancer treatment.  Fecal occult blood test (FOBT) of stool. / Every year beginning at age 67 and continuing until age 58. You may not have to do this test if you get a colonoscopy every 10 years.  Flexible sigmoidoscopy** or colonoscopy.** / Every 5 years for a flexible sigmoidoscopy or every 10 years for a colonoscopy beginning at age 55 and continuing until age 69.  Hepatitis C blood test.** / For all people born from [birth year] through 1967 and any individual with known risks for hepatitis C.  Skin self-exam. / Monthly.  Influenza vaccine. / Every year.  Tetanus, diphtheria, and acellular pertussis (Tdap/Td) vaccine.** / Consult your health care provider. 1 dose of Td every 10 years.  Varicella vaccine.** / Consult your health care provider.  Zoster vaccine.** / 1 dose for adults aged 74 years or older.  Measles, mumps, rubella (MMR) vaccine.** / You need at least 1 dose of MMR if you were born in 1959 or later. You may also need a second dose.  Pneumococcal 13-valent conjugate (PCV13) vaccine.** / Consult your health care provider.  Pneumococcal polysaccharide (PPSV23) vaccine.** / 1 to 2 doses if you smoke cigarettes or if you have certain conditions.  Meningococcal vaccine.** / Consult your health care provider.  Hepatitis A vaccine.** / Consult your health care provider.  Hepatitis B vaccine.** / Consult your health care provider.  Haemophilus influenzae type b (Hib) vaccine.** / Consult your health care provider. Ages 3 and over  Blood pressure check.** / Every year.  Lipid and cholesterol check.**/ Every 5 years beginning at age 15.  Lung cancer screening. / Every year if you are aged 70-80 years and have a 30-pack-year history of smoking and currently  smoke or have quit within the past 15 years. Yearly screening is stopped once you have quit smoking for at least 15 years or develop a health problem that would prevent you from having lung cancer treatment.  Fecal occult blood test (FOBT) of stool. / Every year beginning at age 49 and continuing until age 65. You may not have to do this test if you get a colonoscopy every 10 years.  Flexible sigmoidoscopy** or colonoscopy.** / Every 5 years for a flexible sigmoidoscopy or every 10 years for a colonoscopy beginning at age 16 and continuing until age 75.  Hepatitis C blood test.** / For all people born from [birth year] through 1967 and any individual with known risks for hepatitis C.  Abdominal aortic aneurysm (AAA) screening.** / A one-time screening for ages 9 to 74 years who are current or former smokers.  Skin self-exam. / Monthly.  Influenza vaccine. / Every year.  Tetanus, diphtheria, and acellular pertussis (Tdap/Td) vaccine.** / 1 dose of Td every 10 years.  Varicella vaccine.** / Consult your health care provider.  Zoster vaccine.** / 1 dose for adults aged 9 years or older.  Pneumococcal 13-valent conjugate (PCV13) vaccine.** / 1 dose for all adults aged 96 years and older.  Pneumococcal polysaccharide (PPSV23) vaccine.** / 1 dose for all adults aged 49 years and older.  Meningococcal vaccine.** / Consult your health care provider.  Hepatitis A vaccine.** / Consult your health care provider.  Hepatitis B vaccine.** / Consult your health care provider.  Haemophilus influenzae type b (Hib) vaccine.** / Consult your health care provider. **Family history and personal history of risk and conditions may change your health care provider's recommendations.   This information is not  intended to replace advice given to you by your health care provider. Make sure you discuss any questions you have with your health care provider.   Document Released: 04/22/2001 Document Revised:  03/17/2014 Document Reviewed: 07/22/2010 Elsevier Interactive Patient Education 2016 [place] Raynaud phenomenon is a condition that affects the blood vessels (arteries) that carry blood to your fingers and toes. The arteries that supply blood to your ears or the tip of your nose might also be affected. Raynaud phenomenon causes the arteries to temporarily narrow. As a result, the flow of blood to the affected areas is temporarily decreased. This usually occurs in response to cold temperatures or stress. During an attack, the skin in the affected areas turns white. You may also feel tingling or numbness in those areas. Attacks usually last for only a brief period, and then the blood flow to the area returns to normal. In most cases, Raynaud phenomenon does not cause serious health problems. CAUSES  For many people with this condition, the cause is not known. Raynaud phenomenon is sometimes associated with other diseases, such as scleroderma or lupus. RISK FACTORS Raynaud phenomenon can affect anyone, but it develops most often in people who are 58-3 years old. It affects more females than males. SIGNS AND SYMPTOMS Symptoms of Raynaud phenomenon may occur when you are exposed to cold temperatures or when you have emotional stress. The symptoms may last for a few minutes or up to several hours. They usually affect your fingers but may also affect your toes, ears, or the tip of your nose. Symptoms may include:  Changes in skin color. The skin in the affected areas will turn pale or white. The skin may then change from white to bluish to red as normal blood flow returns to the area.  Numbness, tingling, or pain in the affected areas. In severe cases, sores may develop in the affected areas.  DIAGNOSIS  Your health care provider will do a physical exam and take your medical history. You may be asked to put your hands in cold water to check for a reaction to cold temperature.  Blood tests may be done to check for other diseases or conditions. Your health care provider may also order a test to check the movement of blood through your arteries and veins (vascular ultrasound). TREATMENT  Treatment often involves making lifestyle changes and taking steps to control your exposure to cold temperatures. For more severe cases, medicine (calcium channel blockers) may be used to improve blood flow. Surgery is sometimes done to block the nerves that control the affected arteries, but this is rare. HOME CARE INSTRUCTIONS   Avoid exposure to cold by taking these steps:  If possible, stay indoors during cold weather.  When you go outside during cold weather, dress in layers and wear mittens, a hat, a scarf, and warm footwear.  Wear mittens or gloves when handling ice or frozen food.  Use holders for glasses or cans containing cold drinks.  Let warm water run for a while before taking a shower or bath.  Warm up the car before driving in cold weather.  If possible, avoid stressful and emotional situations. Exercise, meditation, and yoga may help you cope with stress. Biofeedback may be useful.  Do not use any tobacco products, including cigarettes, chewing tobacco, or electronic cigarettes. If you need help quitting, ask your health care provider.  Avoid secondhand smoke.  Limit your use of caffeine. Switch to decaffeinated coffee, tea, and soda. Avoid chocolate.  Wear loose fitting socks and comfortable, roomy shoes.  Avoid vibrating tools and machinery.  Take medicines only as directed by your health care provider. SEEK MEDICAL CARE IF:   Your discomfort becomes worse despite lifestyle changes.  You develop sores on your fingers or toes that do not heal.  Your fingers or toes turn black.  You have breaks in the skin on your fingers or toes.  You have a fever.  You have pain or swelling in your joints.  You have a rash.  Your symptoms occur on only one  side of your body.   This information is not intended to replace advice given to you by your health care provider. Make sure you discuss any questions you have with your health care provider.   Document Released: 02/22/2000 Document Revised: 03/17/2014 Document Reviewed: 09/29/2013 Elsevier Interactive Patient Education [employer].

## 2015-11-04 NOTE — Assessment & Plan Note (Signed)
Encouraged heart healthy diet, increase exercise, avoid trans fats, consider a krill oil cap daily 

## 2015-11-04 NOTE — Assessment & Plan Note (Signed)
On Levothyroxine, continue to monitor 

## 2015-11-04 NOTE — Assessment & Plan Note (Signed)
Patient encouraged to maintain heart healthy diet, regular exercise, adequate sleep. Consider daily probiotics. Take medications as prescribed. Given and reviewed copy of ACP documents from [place] Secretary of State and encouraged to complete and return 

## 2015-11-04 NOTE — Progress Notes (Signed)
Patient ID: Bruce [name], male   DOB: January 05, 1971, 45 y.o.   MRN: [id number]   Subjective:    Patient ID: Bruce [name], male    DOB: 1970-05-07, 45 y.o.   MRN: [id number]  Chief Complaint  Patient presents with  . New Patient (Initial Visit)    HPI Patient is in today for new patient preventative exam. No recent illness or acute concerns. She feels well today. She struggles with allergies, hypothyroidism, hyperlipidemia. Denies CP/palp/SOB/HA/congestion/fevers/GI or GU c/o. Taking meds as prescribed  Past Medical History:  Diagnosis Date  . Allergic state 06/24/2015  . 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  . Environmental and seasonal allergies 06/24/2015  . H/O jaundice 10/26/2015   7 th grade caused by food, no recurrence  . History of influenza 2012   influenza A + ([facility])  . Hypothyroidism    Managed by endo ([facility])  . Inguinal hernia 06/24/2015  . Snoring 06/24/2015    Past Surgical History:  Procedure Laterality Date  . sleep study  01/2012   Home sleep study via [employer] NORMAL    Family History  Problem Relation Age of Onset  . Hypertension Mother   . Prostate cancer Father   . Cancer Father 44    prostate  . Other Son     bown with hydronephrosis, resolved with surgical intervention  . Thyroid disease Neg Hx     Social History   Social History  . Marital status: Married    Spouse name: N/A  . Number of children: N/A  . Years of education: N/A   Occupational History  . Not on file.   Social History Main Topics  . Smoking status: Never Smoker  . Smokeless tobacco: Never Used  . Alcohol use 0.6 oz/week    1 Shots of liquor per week     Comment: occasionally  . Drug use: No  . Sexual activity: Yes   Other Topics Concern  . Not on file   Social History Narrative   Married, 2 children    Master's degree in [place].  Emergency planning/management officer for [employer].   No tobacco, rare alcohol, no drug use.   Exercises regularly.  Typical american diet except avoids red meat.     Outpatient Medications Prior to Visit  Medication Sig Dispense Refill  . levothyroxine (SYNTHROID, LEVOTHROID) 100 MCG tablet Take 1 tablet daily, please dispense generic 90 tablet 1  . azelastine (ASTELIN) 0.1 % nasal spray Reported on 06/15/2015  5  . fluticasone (FLONASE) 50 MCG/ACT nasal spray Place 2 sprays into both nostrils daily. 16 g 6  . montelukast (SINGULAIR) 10 MG tablet Take 1 tablet (10 mg total) by mouth at bedtime. 30 tablet 3  . Multiple Vitamin (MULTIVITAMIN) tablet Take 1 tablet by mouth daily. Reported on 06/15/2015    . ranitidine (ZANTAC) 300 MG tablet Take 0.5 tablets (150 mg total) by mouth at bedtime. 30 tablet 3   No facility-administered medications prior to visit.     No Known Allergies  Review of Systems  Constitutional: Negative for chills, fever and malaise/fatigue.  HENT: Negative for congestion and hearing loss.   Eyes: Negative for discharge.  Respiratory: Negative for cough, sputum production and shortness of breath.   Cardiovascular: Negative for chest pain, palpitations and leg swelling.  Gastrointestinal: Negative for abdominal pain, blood  in stool, constipation, diarrhea, heartburn, nausea and vomiting.  Genitourinary: Negative for dysuria, frequency, hematuria and urgency.  Musculoskeletal: Negative for back pain, falls and myalgias.  Skin: Negative for rash.  Neurological: Negative for dizziness, sensory change, loss of consciousness, weakness and headaches.  Endo/Heme/Allergies: Negative for environmental allergies. Does not bruise/bleed easily.  Psychiatric/Behavioral: Negative for depression and suicidal ideas. The patient is not nervous/anxious and does not have insomnia.        Objective:    Physical Exam  Constitutional: He is oriented to person, place, and time.  He appears well-developed and well-nourished. No distress.  HENT:  Head: Normocephalic and atraumatic.  Eyes: Conjunctivae are normal.  Neck: Neck supple. No thyromegaly present.  Cardiovascular: Normal rate, regular rhythm and normal heart sounds.   No murmur heard. Pulmonary/Chest: Effort normal and breath sounds normal. No respiratory distress. He has no wheezes.  Abdominal: Soft. Bowel sounds are normal. He exhibits no mass. There is no tenderness.  Musculoskeletal: He exhibits no edema.  Lymphadenopathy:    He has no cervical adenopathy.  Neurological: He is alert and oriented to person, place, and time.  Skin: Skin is warm and dry.  Psychiatric: He has a normal mood and affect. His behavior is normal.    BP 110/74 (BP Location: Left Arm, Patient Position: Sitting, Cuff Size: Normal)   Pulse 83   Temp 98.1 F (36.7 C) (Oral)   Resp 16   Ht 5\' 9"  (1.753 m)   Wt 183 lb 6.4 oz (83.2 kg)   SpO2 97%   BMI 27.08 kg/m  Wt Readings from Last 3 Encounters:  10/26/15 183 lb 6.4 oz (83.2 kg)  06/15/15 184 lb 4 oz (83.6 kg)  05/14/15 186 lb 6.4 oz (84.6 kg)     Lab Results  Component Value Date   WBC 6.9 10/18/2015   HGB 16.4 10/18/2015   HCT 47.7 10/18/2015   PLT 213.0 10/18/2015   GLUCOSE 102 (H) 10/18/2015   CHOL 191 10/18/2015   TRIG 72.0 10/18/2015   HDL 34.70 (L) 10/18/2015   LDLDIRECT 147.2 11/22/2010   LDLCALC 142 (H) 10/18/2015   ALT 15 10/18/2015   AST 15 10/18/2015   NA 139 10/18/2015   K 4.5 10/18/2015   CL 105 10/18/2015   CREATININE 1.01 10/18/2015   BUN 13 10/18/2015   CO2 30 10/18/2015   TSH 1.94 10/18/2015   PSA 0.63 11/22/2010    Lab Results  Component Value Date   TSH 1.94 10/18/2015   Lab Results  Component Value Date   WBC 6.9 10/18/2015   HGB 16.4 10/18/2015   HCT 47.7 10/18/2015   MCV 92.1 10/18/2015   PLT 213.0 10/18/2015   Lab Results  Component Value Date   NA 139 10/18/2015   K 4.5 10/18/2015   CO2 30 10/18/2015   GLUCOSE  102 (H) 10/18/2015   BUN 13 10/18/2015   CREATININE 1.01 10/18/2015   BILITOT 0.6 10/18/2015   ALKPHOS 57 10/18/2015   AST 15 10/18/2015   ALT 15 10/18/2015   PROT 7.3 10/18/2015   ALBUMIN 4.3 10/18/2015   CALCIUM 9.3 10/18/2015   GFR 84.17 10/18/2015   Lab Results  Component Value Date   CHOL 191 10/18/2015   Lab Results  Component Value Date   HDL 34.70 (L) 10/18/2015   Lab Results  Component Value Date   LDLCALC 142 (H) 10/18/2015   Lab Results  Component Value Date   TRIG 72.0 10/18/2015   Lab Results  Component Value Date   CHOLHDL 6 10/18/2015   No results found for: HGBA1C     Assessment & Plan:   Problem List Items Addressed This Visit    Hypothyroidism    On Levothyroxine, continue to monitor      Health maintenance examination    Patient encouraged to maintain heart healthy diet, regular exercise, adequate sleep. Consider daily probiotics. Take medications as prescribed. Given and reviewed copy of ACP documents from [employer] and encouraged to complete and return      Pure hypercholesterolemia    Encouraged heart healthy diet, increase exercise, avoid trans fats, consider a krill oil cap daily      Snoring    Resolved cough but snoring has returned since stopping.      Environmental and seasonal allergies    Spring is the worst season and stress contributes to symptoms Consider NOW probiotics daily       Inguinal hernia    Right sided, asymptomatic, improved with weight loss      H/O jaundice    Other Visit Diagnoses   None.     I have discontinued Mr. Truex multivitamin, azelastine, fluticasone, montelukast, and ranitidine. I am also having him maintain his levothyroxine.  No orders of the defined types were placed in this encounter.    Danise Edge, MD

## 2015-11-10 ENCOUNTER — Other Ambulatory Visit: Payer: Self-pay | Admitting: Endocrinology

## 2015-11-13 ENCOUNTER — Telehealth: Payer: Self-pay | Admitting: Family Medicine

## 2015-11-13 ENCOUNTER — Encounter: Payer: Self-pay | Admitting: Physician Assistant

## 2015-11-13 ENCOUNTER — Ambulatory Visit (INDEPENDENT_AMBULATORY_CARE_PROVIDER_SITE_OTHER): Payer: Commercial Managed Care - PPO | Admitting: Physician Assistant

## 2015-11-13 VITALS — BP 118/82 | HR 94 | Temp 98.2°F | Resp 16 | Ht 69.0 in | Wt 184.4 lb

## 2015-11-13 DIAGNOSIS — R399 Unspecified symptoms and signs involving the genitourinary system: Secondary | ICD-10-CM

## 2015-11-13 DIAGNOSIS — N39498 Other specified urinary incontinence: Secondary | ICD-10-CM | POA: Diagnosis not present

## 2015-11-13 LAB — POCT URINALYSIS DIPSTICK
BILIRUBIN UA: NEGATIVE
Glucose, UA: NEGATIVE
KETONES UA: NEGATIVE
Leukocytes, UA: NEGATIVE
NITRITE UA: NEGATIVE
PH UA: 6
Protein, UA: NEGATIVE
RBC UA: NEGATIVE
SPEC GRAV UA: 1.02
Urobilinogen, UA: 0.2

## 2015-11-13 MED ORDER — SULFAMETHOXAZOLE-TRIMETHOPRIM 800-160 MG PO TABS: 1.0000 | ORAL_TABLET | Freq: Two times a day (BID) | ORAL | 0 refills | Status: DC

## 2015-11-13 NOTE — Telephone Encounter (Signed)
I have never met the patient so very hard for me to advise. If he has a standing Urologist and they can fit him in then that is not a bad idea. If they cannot see him I am happy to see him.

## 2015-11-13 NOTE — Telephone Encounter (Signed)
error 

## 2015-11-13 NOTE — Telephone Encounter (Signed)
This is not Dr. Blyth's patient. 

## 2015-11-13 NOTE — Telephone Encounter (Signed)
At this point we need a UA with c and s due to symptoms then can refer to urology as needed. Can be with or without appt but since I will not be here would keep appt for full eval I think

## 2015-11-13 NOTE — Telephone Encounter (Signed)
Oh ok will forward message to her then

## 2015-11-13 NOTE — Progress Notes (Signed)
Patient presents to clinic today c/o 4 days of dysuria, urinary hesitancy and episode of urinary incontinence. Denies back pain, saddle anesthesia or change to bowel habits. Is sexually active with one monogamous male partner. No concern for STI. Endorses discomfort at end of urination only. Denies hematuria, nausea or vomiting. Endorses urinary hesitancy. Denies history of enlarged prostate. Has strong family history of prostate cancer.  Past Medical History:  Diagnosis Date  . Allergic state 06/24/2015  . 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  . Environmental and seasonal allergies 06/24/2015  . H/O jaundice 10/26/2015   7 th grade caused by food, no recurrence  . History of influenza 2012   influenza A + ([facility])  . Hypothyroidism    Managed by endo ([facility])  . Inguinal hernia 06/24/2015  . Snoring 06/24/2015    Current Outpatient Prescriptions on File Prior to Visit  Medication Sig Dispense Refill  . levothyroxine (SYNTHROID, LEVOTHROID) 100 MCG tablet TAKE 1 TABLET BY MOUTH ONCE A DAY 90 tablet 1   No current facility-administered medications on file prior to visit.     No Known Allergies  Family History  Problem Relation Age of Onset  . Hypertension Mother   . Prostate cancer Father   . Cancer Father 79    prostate  . Other Son     bown with hydronephrosis, resolved with surgical intervention  . Thyroid disease Neg Hx     Social History   Social History  . Marital status: Married    Spouse name: N/A  . Number of children: N/A  . Years of education: N/A   Social History Main Topics  . Smoking status: Never Smoker  . Smokeless tobacco: Never Used  . Alcohol use 0.6 oz/week    1 Shots of liquor per week     Comment: occasionally  . Drug use: No  . Sexual activity: Yes   Other Topics Concern  .  None   Social History Narrative   Married, 2 children    Master's degree in [place].  Emergency planning/management officer for [employer].   No tobacco, rare alcohol, no drug use.   Exercises regularly.  Typical american diet except avoids red meat.     Review of Systems - See HPI.  All other ROS are negative.  BP 118/82 (BP Location: Right Arm, Patient Position: Sitting, Cuff Size: Large)   Pulse 94   Temp 98.2 F (36.8 C) (Oral)   Resp 16   Ht 5\' 9"  (1.753 m)   Wt 184 lb 6 oz (83.6 kg)   SpO2 98%   BMI 27.23 kg/m   Physical Exam  Constitutional: He is oriented to person, place, and time and well-developed, well-nourished, and in no distress.  HENT:  Head: Normocephalic and atraumatic.  Neck: Neck supple.  Cardiovascular: Normal rate, regular rhythm, normal heart sounds and intact distal pulses.   Pulmonary/Chest: Effort normal and breath sounds normal. No respiratory distress. He has no wheezes. He has no rales. He exhibits no tenderness.  Abdominal: Soft. Bowel sounds are normal. He exhibits no distension. There is no tenderness.  Genitourinary: Prostate is not enlarged and not tender.  Neurological: He is alert and oriented to person, place, and time.  Skin: Skin is warm and dry. No rash noted.  Psychiatric: Affect normal.  Vitals reviewed.  Recent Results (from the past 2160 hour(s))  CBC     Status: None   Collection Time: 10/18/15  8:40 AM  Result Value Ref Range   WBC 6.9 4.0 - 10.5 K/uL   RBC 5.18 4.22 - 5.81 Mil/uL   Platelets 213.0 150.0 - 400.0 K/uL   Hemoglobin 16.4 13.0 - 17.0 g/dL   HCT 47.847.7 29.539.0 - 62.152.0 %   MCV 92.1 78.0 - 100.0 fl   MCHC 34.3 30.0 - 36.0 g/dL   RDW 30.813.0 65.711.5 - 84.615.5 %  Lipid panel     Status: Abnormal   Collection Time: 10/18/15  8:40 AM  Result Value Ref Range   Cholesterol 191 0 - 200 mg/dL    Comment: ATP III Classification       Desirable:  < 200 mg/dL               Borderline High:  200 - 239 mg/dL          High:  > = 962240 mg/dL   Triglycerides 95.272.0  0.0 - 149.0 mg/dL    Comment: Normal:  <841<150 mg/dLBorderline High:  150 - 199 mg/dL   HDL 32.4434.70 (L) >01.02>39.00 mg/dL   VLDL 72.514.4 0.0 - 36.640.0 mg/dL   LDL Cholesterol 440142 (H) 0 - 99 mg/dL   Total CHOL/HDL Ratio 6     Comment:                Men          Women1/2 Average Risk     3.4          3.3Average Risk          5.0          4.42X Average Risk          9.6          7.13X Average Risk          15.0          11.0                       NonHDL 156.65     Comment: NOTE:  Non-HDL goal should be 30 mg/dL higher than patient's LDL goal (i.e. LDL goal of < 70 mg/dL, would have non-HDL goal of < 100 mg/dL)  TSH     Status: None   Collection Time: 10/18/15  8:40 AM  Result Value Ref Range   TSH 1.94 0.35 - 4.50 uIU/mL  Comprehensive metabolic panel     Status: Abnormal   Collection Time: 10/18/15  8:40 AM  Result Value Ref Range   Sodium 139 135 - 145 mEq/L   Potassium 4.5 3.5 - 5.1 mEq/L   Chloride 105 96 - 112 mEq/L   CO2 30 19 - 32 mEq/L   Glucose, Bld 102 (H) 70 - 99 mg/dL   BUN 13 6 - 23 mg/dL   Creatinine, Ser 3.471.01 0.40 - 1.50 mg/dL   Total Bilirubin 0.6 0.2 - 1.2 mg/dL   Alkaline Phosphatase 57 39 - 117 U/L   AST 15 0 - 37 U/L   ALT 15 0 - 53 U/L   Total Protein 7.3 6.0 - 8.3 g/dL   Albumin 4.3 3.5 - 5.2 g/dL   Calcium 9.3 8.4 - 42.510.5 mg/dL   GFR 95.6384.17 >87.56>60.00 mL/min    Assessment/Plan: 1. UTI symptoms Questionable prostatitis. Symptoms fit but exam without tender prostate and UA unremarkable for sign of infection. Will  send for culture. Will start Bactrim empirically while cultures pending. Typically avoid PSA testing if concern for prostatitis but there is also concern for enlarged prostate as cause of urinary incontinence 2/2 overflow.  - POCT urinalysis dipstick - PSA - CULTURE, URINE COMPREHENSIVE - sulfamethoxazole-trimethoprim (BACTRIM DS,SEPTRA DS) 800-160 MG tablet; Take 1 tablet by mouth 2 (two) times daily.  Dispense: 14 tablet; Refill: 0   Piedad Climes, [place]

## 2015-11-13 NOTE — Telephone Encounter (Signed)
Caller name: Relationship to patient: self Can be reached: [phone] Pharmacy:  Reason for call: Pt called stating he is having burning with urination that he did not have Friday when he was seen. [facility] Pt is wondering if he should see us or go to urology and is requesting call from nurse prior to appt.

## 2015-11-13 NOTE — Telephone Encounter (Signed)
Pt transferred to Dr. Abner GreenspanBlyth per chart review. PCP updated in chart. Selena Battenody, please advise appt today vs w/ urology?

## 2015-11-13 NOTE — Telephone Encounter (Signed)
Patient seen by Marcelline MatesWilliam Martin PA today at 2:30.

## 2015-11-13 NOTE — Patient Instructions (Addendum)
Please stay well hydrated.  You can get some AZO over-the-counter to help with burning sensation.   Please take the antibiotic as directed over the next few days while we are waiting on your results and an appointment with the Urologist.  If you notice any back pain, numbness in the groin or other episodes of incontinence with these, please go to the ER.

## 2015-11-13 NOTE — Telephone Encounter (Signed)
Pt saw Dr. Abner GreenspanBlyth 10/26/15 as new pt, I will update PCP

## 2015-11-14 LAB — PSA: PSA: 17.52 ng/mL — AB (ref 0.10–4.00)

## 2015-11-15 LAB — CULTURE, URINE COMPREHENSIVE

## 2015-11-15 MED ORDER — SULFAMETHOXAZOLE-TRIMETHOPRIM 800-160 MG PO TABS: 1.0000 | ORAL_TABLET | Freq: Two times a day (BID) | ORAL | 0 refills | Status: DC

## 2015-11-27 ENCOUNTER — Encounter: Payer: Self-pay | Admitting: Physician Assistant

## 2015-11-27 ENCOUNTER — Ambulatory Visit (INDEPENDENT_AMBULATORY_CARE_PROVIDER_SITE_OTHER): Payer: Commercial Managed Care - PPO | Admitting: Physician Assistant

## 2015-11-27 VITALS — BP 98/72 | HR 73 | Temp 98.0°F | Resp 16 | Ht 69.0 in | Wt 182.1 lb

## 2015-11-27 DIAGNOSIS — N410 Acute prostatitis: Secondary | ICD-10-CM | POA: Diagnosis not present

## 2015-11-27 DIAGNOSIS — R972 Elevated prostate specific antigen [PSA]: Secondary | ICD-10-CM | POA: Diagnosis not present

## 2015-11-27 LAB — POCT URINALYSIS DIPSTICK
Bilirubin, UA: NEGATIVE
GLUCOSE UA: NEGATIVE
Ketones, UA: NEGATIVE
Leukocytes, UA: NEGATIVE
NITRITE UA: NEGATIVE
Protein, UA: NEGATIVE
RBC UA: NEGATIVE
Spec Grav, UA: 1.025
UROBILINOGEN UA: 0.2
pH, UA: 6

## 2015-11-27 NOTE — Patient Instructions (Signed)
Please keep hydrated. Your infections seems clinically resolved. We are rechecking a urine culture.  Please return in 1.5 weeks for your repeat PSA testing. I will call you with your results and we will go from there.

## 2015-11-27 NOTE — Progress Notes (Signed)
Patient presents to clinic today for follow-up of acute bacterial prostatitis, s/p  course of Bactrim. Patient endorses taking medications as directed. Endorses resolution of symptoms with antibiotic. Denies residual dysuria, urinary changes, fever, chills, nausea or vomiting.   Past Medical History:  Diagnosis Date  . Allergic state 06/24/2015  . 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  . Environmental and seasonal allergies 06/24/2015  . H/O jaundice 10/26/2015   7 th grade caused by food, no recurrence  . History of influenza 2012   influenza A + ([facility])  . Hypothyroidism    Managed by endo ([facility])  . Inguinal hernia 06/24/2015  . Snoring 06/24/2015    Current Outpatient Prescriptions on File Prior to Visit  Medication Sig Dispense Refill  . levothyroxine (SYNTHROID, LEVOTHROID) 100 MCG tablet TAKE 1 TABLET BY MOUTH ONCE A DAY 90 tablet 1   No current facility-administered medications on file prior to visit.     No Known Allergies  Family History  Problem Relation Age of Onset  . Hypertension Mother   . Prostate cancer Father   . Cancer Father [id number]    prostate  . Other Son     bown with hydronephrosis, resolved with surgical intervention  . Thyroid disease Neg Hx     Social History   Social History  . Marital status: Married    Spouse name: N/A  . Number of children: N/A  . Years of education: N/A   Social History Main Topics  . Smoking status: Never Smoker  . Smokeless tobacco: Never Used  . Alcohol use 0.6 oz/week    1 Shots of liquor per week     Comment: occasionally  . Drug use: No  . Sexual activity: Yes   Other Topics Concern  . None   Social History Narrative   Married, 2 children    Master's degree in [place].  Emergency planning/management officerroject manager for [employer]   No tobacco, rare alcohol, no drug use.   Exercises regularly.  Typical american diet except avoids red meat.    Review of Systems - See HPI.  All other ROS are negative.  BP 98/72 (BP Location: Right Arm, Patient Position: Sitting, Cuff Size: Large)   Pulse 73   Temp 98 F (36.7 C) (Oral)   Resp 16   Ht 5\' 9"  (1.753 m)   Wt 182 lb 2 oz (82.6 kg)   SpO2 98%   BMI 26.90 kg/m   Physical Exam  Constitutional: He is oriented to person, place, and time and well-developed, well-nourished, and in no distress.  HENT:  Head: Normocephalic and atraumatic.  Eyes: Conjunctivae are normal.  Cardiovascular: Normal rate, regular rhythm, normal heart sounds and intact distal pulses.   Pulmonary/Chest: Effort normal and breath sounds normal. No respiratory distress. He has no wheezes. He has no rales. He exhibits no tenderness.  Abdominal: Soft. Bowel sounds are normal. He exhibits no distension and no mass. There is no tenderness. There is no rebound and no guarding.  Neurological: He is alert and oriented to person, place, and time.  Skin: Skin is warm and dry. No rash noted.  Psychiatric: Affect normal.  Vitals reviewed.   Recent Results (from the past 2160 hour(s))  CBC     Status: None   Collection Time: 10/18/15  8:40 AM  Result Value Ref  Range   WBC 6.9 4.0 - 10.5 K/uL   RBC 5.18 4.22 - 5.81 Mil/uL   Platelets 213.0 150.0 - 400.0 K/uL   Hemoglobin 16.4 13.0 - 17.0 g/dL   HCT 16.1 09.6 - 04.5 %   MCV 92.1 78.0 - 100.0 fl   MCHC 34.3 30.0 - 36.0 g/dL   RDW 40.9 81.1 - 91.4 %  Lipid panel     Status: Abnormal   Collection Time: 10/18/15  8:40 AM  Result Value Ref Range   Cholesterol 191 0 - 200 mg/dL    Comment: ATP III Classification       Desirable:  < 200 mg/dL               Borderline High:  200 - 239 mg/dL          High:  > = 782 mg/dL   Triglycerides 95.6 0.0 - 149.0 mg/dL    Comment: Normal:  <213 mg/dLBorderline High:  150 - 199 mg/dL   HDL 08.65 (L) >78.46 mg/dL   VLDL 96.2 0.0 - 95.2 mg/dL   LDL Cholesterol 841  (H) 0 - 99 mg/dL   Total CHOL/HDL Ratio 6     Comment:                Men          Women1/2 Average Risk     3.4          3.3Average Risk          5.0          4.42X Average Risk          9.6          7.13X Average Risk          15.0          11.0                       NonHDL 156.65     Comment: NOTE:  Non-HDL goal should be 30 mg/dL higher than patient's LDL goal (i.e. LDL goal of < 70 mg/dL, would have non-HDL goal of < 100 mg/dL)  TSH     Status: None   Collection Time: 10/18/15  8:40 AM  Result Value Ref Range   TSH 1.94 0.35 - 4.50 uIU/mL  Comprehensive metabolic panel     Status: Abnormal   Collection Time: 10/18/15  8:40 AM  Result Value Ref Range   Sodium 139 135 - 145 mEq/L   Potassium 4.5 3.5 - 5.1 mEq/L   Chloride 105 96 - 112 mEq/L   CO2 30 19 - 32 mEq/L   Glucose, Bld 102 (H) 70 - 99 mg/dL   BUN 13 6 - 23 mg/dL   Creatinine, Ser 3.24 0.40 - 1.50 mg/dL   Total Bilirubin 0.6 0.2 - 1.2 mg/dL   Alkaline Phosphatase 57 39 - 117 U/L   AST 15 0 - 37 U/L   ALT 15 0 - 53 U/L   Total Protein 7.3 6.0 - 8.3 g/dL   Albumin 4.3 3.5 - 5.2 g/dL   Calcium 9.3 8.4 - 40.1 mg/dL   GFR 02.72 >53.66 mL/min  POCT urinalysis dipstick     Status: None   Collection Time: 11/13/15  2:52 PM  Result Value Ref Range   Color, UA straw    Clarity, UA clear    Glucose, UA neg    Bilirubin, UA neg    Ketones, UA  neg    Spec Grav, UA 1.020    Blood, UA negative    pH, UA 6.0    Protein, UA neg    Urobilinogen, UA 0.2    Nitrite, UA neg    Leukocytes, UA Negative Negative  CULTURE, URINE COMPREHENSIVE     Status: None   Collection Time: 11/13/15  3:23 PM  Result Value Ref Range   Culture ESCHERICHIA COLI    Colony Count 10,000-50,000 CFU/mL    Organism ID, Bacteria ESCHERICHIA COLI       Susceptibility   Escherichia coli -  (no method available)    AMPICILLIN <=2 Sensitive     AMOX/CLAVULANIC <=2 Sensitive     AMPICILLIN/SULBACTAM <=2 Sensitive     PIP/TAZO <=4 Sensitive     IMIPENEM  <=0.25 Sensitive     CEFAZOLIN <=4 Not Reportable     CEFTRIAXONE <=1 Sensitive     CEFTAZIDIME <=1 Sensitive     CEFEPIME <=1 Sensitive     GENTAMICIN <=1 Sensitive     TOBRAMYCIN <=1 Sensitive     CIPROFLOXACIN <=0.25 Sensitive     LEVOFLOXACIN <=0.12 Sensitive     NITROFURANTOIN <=16 Sensitive     TRIMETH/SULFA* <=20 Sensitive      * NR=NOT REPORTABLE,SEE COMMENTORAL therapy:A cefazolin MIC of <32 predicts susceptibility to the oral agents cefaclor,cefdinir,cefpodoxime,cefprozil,cefuroxime,cephalexin,and loracarbef when used for therapy of uncomplicated UTIs due to E.coli,K.pneumomiae,and P.mirabilis. PARENTERAL therapy: A cefazolinMIC of >8 indicates resistance to parenteralcefazolin. An alternate test method must beperformed to confirm susceptibility to parenteralcefazolin.  PSA     Status: Abnormal   Collection Time: 11/13/15  3:30 PM  Result Value Ref Range   PSA 17.52 (H) 0.10 - 4.00 ng/mL   Assessment/Plan: 1. Acute prostatitis Clinically resolved. Urine dip unremarkable. Will check urine culture. Nothing further needed at present. Reviewed signs/symptoms of recurrent prostatitis.  - POCT urinalysis dipstick - CULTURE, URINE COMPREHENSIVE  2. Elevated PSA 2/2 prostatitis. Resolved. Will repeat PSA in 2 weeks to assess baseline PSA levels per patient request. - PSA; Future   Piedad Climes, PA-C

## 2015-11-30 LAB — CULTURE, URINE COMPREHENSIVE: Organism ID, Bacteria: NO GROWTH

## 2015-12-15 LAB — PSA: PSA: 2

## 2016-01-15 ENCOUNTER — Encounter: Payer: Self-pay | Admitting: Physician Assistant

## 2016-03-11 ENCOUNTER — Encounter: Payer: Self-pay | Admitting: Family Medicine

## 2016-03-11 ENCOUNTER — Other Ambulatory Visit: Payer: Self-pay | Admitting: Family Medicine

## 2016-03-11 MED ORDER — BENZONATATE 100 MG PO CAPS: 100.0000 mg | ORAL_CAPSULE | Freq: Three times a day (TID) | ORAL | 0 refills | Status: DC | PRN

## 2016-03-11 NOTE — Telephone Encounter (Signed)
Tessalon perles 100mg   1-2 po tid prn 30

## 2016-04-22 ENCOUNTER — Encounter: Payer: Self-pay | Admitting: Family Medicine

## 2016-05-13 ENCOUNTER — Other Ambulatory Visit (INDEPENDENT_AMBULATORY_CARE_PROVIDER_SITE_OTHER): Payer: Commercial Managed Care - PPO

## 2016-05-13 ENCOUNTER — Ambulatory Visit: Payer: Commercial Managed Care - PPO | Admitting: Endocrinology

## 2016-05-13 DIAGNOSIS — E038 Other specified hypothyroidism: Secondary | ICD-10-CM

## 2016-05-13 DIAGNOSIS — E063 Autoimmune thyroiditis: Secondary | ICD-10-CM

## 2016-05-13 LAB — TSH: TSH: 2.02 u[IU]/mL (ref 0.35–4.50)

## 2016-05-13 LAB — T4, FREE: Free T4: 1.09 ng/dL (ref 0.60–1.60)

## 2016-05-16 ENCOUNTER — Encounter: Payer: Self-pay | Admitting: Endocrinology

## 2016-05-16 ENCOUNTER — Ambulatory Visit (INDEPENDENT_AMBULATORY_CARE_PROVIDER_SITE_OTHER): Payer: Commercial Managed Care - PPO | Admitting: Endocrinology

## 2016-05-16 VITALS — BP 120/76 | HR 74 | Ht 69.0 in | Wt 185.0 lb

## 2016-05-16 DIAGNOSIS — E063 Autoimmune thyroiditis: Secondary | ICD-10-CM

## 2016-05-16 DIAGNOSIS — E038 Other specified hypothyroidism: Secondary | ICD-10-CM | POA: Diagnosis not present

## 2016-05-16 MED ORDER — LEVOTHYROXINE SODIUM 100 MCG PO TABS: 100.0000 ug | ORAL_TABLET | Freq: Every day | ORAL | 3 refills | Status: DC

## 2016-05-16 NOTE — Progress Notes (Signed)
Patient ID: Bruce SeltzerSharan [name], male   DOB: Dec 06, 1970, 46 y.o.   MRN: [id number]  Reason for Appointment:  Hypothyroidism, followup visit    History of Present Illness:   The hypothyroidism was first diagnosed in 2012  Initially had significant nonspecific fatigue and some cold intolerance.  No details are available about the initial diagnosis but his TSH in 2012 was 5.3  The patient has been treated with 100 mcg daily   Patient has no complaints of unusual fatigue,  unusual weight gain  The patient is taking the thyroid supplement very regularly in the morning before breakfast.   He is  taking generic levothyroxine instead of brand name because of difference in cost, getting this from [facility] consistently  His TSH is now consistently normal  Lab Results  Component Value Date   TSH 2.02 05/13/2016   TSH 1.94 10/18/2015   TSH 1.85 06/15/2015   FREET4 1.09 05/13/2016   FREET4 1.17 05/08/2015   FREET4 1.01 06/24/2013      Allergies as of 05/16/2016   No Known Allergies     Medication List       Accurate as of 05/16/16 11:59 PM. Always use your most recent med list.          benzonatate 100 MG capsule Commonly known as:  TESSALON Take 1 capsule (100 mg total) by mouth 3 (three) times daily as needed for cough.   levothyroxine 100 MCG tablet Commonly known as:  SYNTHROID, LEVOTHROID Take 1 tablet (100 mcg total) by mouth daily.       Allergies: No Known Allergies  Past Medical History:  Diagnosis Date  . Allergic state 06/24/2015  . 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  . Environmental and seasonal allergies 06/24/2015  . H/O jaundice 10/26/2015   7 th grade caused by food, no recurrence  . History of influenza 2012   influenza A + ([facility])  . Hypothyroidism    Managed by endo ([facility])  .  Inguinal hernia 06/24/2015  . Snoring 06/24/2015    Past Surgical History:  Procedure Laterality Date  . sleep study  01/2012   Home sleep study via [employer] NORMAL    Family History  Problem Relation Age of Onset  . Hypertension Mother   . Diabetes Mother   . Prostate cancer Father   . Cancer Father [phone]    prostate  . Other Son     bown with hydronephrosis, resolved with surgical intervention  . Thyroid disease Neg Hx     Social History:  reports that he has never smoked. He has never used smokeless tobacco. He reports that he drinks about 0.6 oz of alcohol per week . He reports that he does not use drugs.  REVIEW Of SYSTEMS:  Has had Fairly consistent weight He thinks he is exercising regularly   Wt Readings from Last 3 Encounters:  05/16/16 185 lb (83.9 kg)  11/27/15 182 lb 2 oz (82.6 kg)  11/13/15 184 lb 6 oz (83.6 kg)    GLUCOSE level was 102 although he thinks this was not fasting  He does appear to have mild dyslipidemia, does have family history of diabetes  Lab Results  Component Value Date   CHOL 191 10/18/2015   HDL 34.70 (L) 10/18/2015   LDLCALC 142 (H) 10/18/2015   LDLDIRECT 147.2  11/22/2010   TRIG 72.0 10/18/2015   CHOLHDL 6 10/18/2015      Examination:   BP 120/76   Pulse 74   Ht 5\' 9"  (1.753 m)   Wt 185 lb (83.9 kg)   SpO2 97%   BMI 27.32 kg/m     NECK: Thyroid is not palpable             Assessment   Hypothyroidism, primary and without a goiter He feels subjectively fairly good Has been consistent with her dose of 100 g daily    Treatment:   Continue same dosage before breakfast daily  Discussed low saturated fat diet for his dyslipidemia Although he has no other risk factors needs periodic evaluation fasting glucose with his mother being diagnosed as having diabetes  Follow-up in 12 months  Bruce [name] 05/17/2016, 5:42 PM

## 2016-05-17 ENCOUNTER — Encounter: Payer: Self-pay | Admitting: Endocrinology

## 2016-09-20 ENCOUNTER — Telehealth: Payer: Commercial Managed Care - PPO | Admitting: Nurse Practitioner

## 2016-09-20 DIAGNOSIS — R059 Cough, unspecified: Secondary | ICD-10-CM

## 2016-09-20 DIAGNOSIS — R05 Cough: Secondary | ICD-10-CM

## 2016-09-20 MED ORDER — AZITHROMYCIN 250 MG PO TABS: ORAL_TABLET | ORAL | 0 refills | Status: DC

## 2016-09-20 MED ORDER — BENZONATATE 100 MG PO CAPS: 100.0000 mg | ORAL_CAPSULE | Freq: Three times a day (TID) | ORAL | 0 refills | Status: DC | PRN

## 2016-09-20 NOTE — Progress Notes (Signed)
We are sorry that you are not feeling well.  Here is how we plan to help!  Based on your presentation I believe you most likely have A cough due to bacteria.  When patients have a fever and a productive cough with a change in color or increased sputum production, we are concerned about bacterial bronchitis.  If left untreated it can progress to pneumonia.  If your symptoms do not improve with your treatment plan it is important that you contact your provider.   I have prescribed Azithromyin 250 mg: two tables now and then one tablet daily for 4 additonal days    In addition you may use A prescription cough medication called Tessalon Perles 100mg. You may take 1-2 capsules every 8 hours as needed for your cough.    From your responses in the eVisit questionnaire you describe inflammation in the upper respiratory tract which is causing a significant cough.  This is commonly called Bronchitis and has four common causes:    Allergies  Viral Infections  Acid Reflux  Bacterial Infection Allergies, viruses and acid reflux are treated by controlling symptoms or eliminating the cause. An example might be a cough caused by taking certain blood pressure medications. You stop the cough by changing the medication. Another example might be a cough caused by acid reflux. Controlling the reflux helps control the cough.  USE OF BRONCHODILATOR ("RESCUE") INHALERS: There is a risk from using your bronchodilator too frequently.  The risk is that over-reliance on a medication which only relaxes the muscles surrounding the breathing tubes can reduce the effectiveness of medications prescribed to reduce swelling and congestion of the tubes themselves.  Although you feel brief relief from the bronchodilator inhaler, your asthma may actually be worsening with the tubes becoming more swollen and filled with mucus.  This can delay other crucial treatments, such as oral steroid medications. If you need to use a  bronchodilator inhaler daily, several times per day, you should discuss this with your provider.  There are probably better treatments that could be used to keep your asthma under control.     HOME CARE . Only take medications as instructed by your medical team. . Complete the entire course of an antibiotic. . Drink plenty of fluids and get plenty of rest. . Avoid close contacts especially the very young and the elderly . Cover your mouth if you cough or cough into your sleeve. . Always remember to wash your hands . A steam or ultrasonic humidifier can help congestion.   GET HELP RIGHT AWAY IF: . You develop worsening fever. . You become short of breath . You cough up blood. . Your symptoms persist after you have completed your treatment plan MAKE SURE YOU   Understand these instructions.  Will watch your condition.  Will get help right away if you are not doing well or get worse.  Your e-visit answers were reviewed by a board certified advanced clinical practitioner to complete your personal care plan.  Depending on the condition, your plan could have included both over the counter or prescription medications. If there is a problem please reply  once you have received a response from your provider. Your safety is important to us.  If you have drug allergies check your prescription carefully.    You can use MyChart to ask questions about today's visit, request a non-urgent call back, or ask for a work or school excuse for 24 hours related to this e-Visit. If it has   been greater than 24 hours you will need to follow up with your provider, or enter a new e-Visit to address those concerns. You will get an e-mail in the next two days asking about your experience.  I hope that your e-visit has been valuable and will speed your recovery. Thank you for using e-visits.   

## 2016-10-27 ENCOUNTER — Encounter: Payer: Self-pay | Admitting: Family Medicine

## 2016-10-27 DIAGNOSIS — E785 Hyperlipidemia, unspecified: Secondary | ICD-10-CM

## 2016-10-27 DIAGNOSIS — E039 Hypothyroidism, unspecified: Secondary | ICD-10-CM

## 2016-10-27 DIAGNOSIS — R739 Hyperglycemia, unspecified: Secondary | ICD-10-CM

## 2016-10-29 ENCOUNTER — Other Ambulatory Visit (INDEPENDENT_AMBULATORY_CARE_PROVIDER_SITE_OTHER): Payer: Commercial Managed Care - PPO

## 2016-10-29 DIAGNOSIS — R739 Hyperglycemia, unspecified: Secondary | ICD-10-CM

## 2016-10-29 DIAGNOSIS — R972 Elevated prostate specific antigen [PSA]: Secondary | ICD-10-CM | POA: Diagnosis not present

## 2016-10-29 DIAGNOSIS — E039 Hypothyroidism, unspecified: Secondary | ICD-10-CM

## 2016-10-29 DIAGNOSIS — E785 Hyperlipidemia, unspecified: Secondary | ICD-10-CM

## 2016-10-29 LAB — CBC WITH DIFFERENTIAL/PLATELET
BASOS ABS: 0 10*3/uL (ref 0.0–0.1)
Basophils Relative: 0.2 % (ref 0.0–3.0)
EOS ABS: 0.1 10*3/uL (ref 0.0–0.7)
Eosinophils Relative: 1.7 % (ref 0.0–5.0)
HEMATOCRIT: 45.5 % (ref 39.0–52.0)
HEMOGLOBIN: 15.5 g/dL (ref 13.0–17.0)
Lymphocytes Relative: 45.4 % (ref 12.0–46.0)
Lymphs Abs: 2.6 10*3/uL (ref 0.7–4.0)
MCHC: 34.1 g/dL (ref 30.0–36.0)
MCV: 91.4 fl (ref 78.0–100.0)
MONO ABS: 0.7 10*3/uL (ref 0.1–1.0)
Monocytes Relative: 12.1 % — ABNORMAL HIGH (ref 3.0–12.0)
NEUTROS ABS: 2.4 10*3/uL (ref 1.4–7.7)
Neutrophils Relative %: 40.6 % — ABNORMAL LOW (ref 43.0–77.0)
PLATELETS: 210 10*3/uL (ref 150.0–400.0)
RBC: 4.98 Mil/uL (ref 4.22–5.81)
RDW: 13.4 % (ref 11.5–15.5)
WBC: 5.8 10*3/uL (ref 4.0–10.5)

## 2016-10-29 LAB — COMPREHENSIVE METABOLIC PANEL
ALBUMIN: 4.1 g/dL (ref 3.5–5.2)
ALK PHOS: 58 U/L (ref 39–117)
ALT: 18 U/L (ref 0–53)
AST: 17 U/L (ref 0–37)
BILIRUBIN TOTAL: 0.8 mg/dL (ref 0.2–1.2)
BUN: 13 mg/dL (ref 6–23)
CO2: 30 mEq/L (ref 19–32)
CREATININE: 1.06 mg/dL (ref 0.40–1.50)
Calcium: 9.2 mg/dL (ref 8.4–10.5)
Chloride: 105 mEq/L (ref 96–112)
GFR: 79.25 mL/min (ref >60.00)
GLUCOSE: 89 mg/dL (ref 70–99)
Potassium: 4.4 mEq/L (ref 3.5–5.1)
SODIUM: 140 meq/L (ref 135–145)
TOTAL PROTEIN: 6.6 g/dL (ref 6.0–8.3)

## 2016-10-29 LAB — TSH: TSH: 2.39 u[IU]/mL (ref 0.35–4.50)

## 2016-10-29 LAB — PSA: PSA: 0.99 ng/mL (ref 0.10–4.00)

## 2016-10-29 LAB — LIPID PANEL
CHOL/HDL RATIO: 5
CHOLESTEROL: 196 mg/dL (ref 0–200)
HDL: 36.9 mg/dL — AB (ref >39.00)
LDL Cholesterol: 138 mg/dL — ABNORMAL HIGH (ref 0–99)
NonHDL: 158.68
TRIGLYCERIDES: 104 mg/dL (ref 0.0–149.0)
VLDL: 20.8 mg/dL (ref 0.0–40.0)

## 2016-10-30 ENCOUNTER — Ambulatory Visit (INDEPENDENT_AMBULATORY_CARE_PROVIDER_SITE_OTHER): Payer: Commercial Managed Care - PPO | Admitting: Family Medicine

## 2016-10-30 ENCOUNTER — Encounter: Payer: Self-pay | Admitting: Family Medicine

## 2016-10-30 VITALS — BP 124/84 | HR 88 | Temp 98.1°F | Ht 69.0 in | Wt 189.6 lb

## 2016-10-30 DIAGNOSIS — R0602 Shortness of breath: Secondary | ICD-10-CM | POA: Diagnosis not present

## 2016-10-30 DIAGNOSIS — Z0001 Encounter for general adult medical examination with abnormal findings: Secondary | ICD-10-CM

## 2016-10-30 DIAGNOSIS — E039 Hypothyroidism, unspecified: Secondary | ICD-10-CM

## 2016-10-30 DIAGNOSIS — E7800 Pure hypercholesterolemia, unspecified: Secondary | ICD-10-CM

## 2016-10-30 DIAGNOSIS — Z0000 Encounter for general adult medical examination without abnormal findings: Secondary | ICD-10-CM

## 2016-10-30 NOTE — Progress Notes (Signed)
Subjective:  I acted as a Neurosurgeon for Dr. Abner Greenspan. [place], [place]  Patient ID: Bruce [name], male    DOB: 09/15/1970, 46 y.o.   MRN: [id number]  Chief Complaint  Patient presents with  . Annual Exam    Patient is here today for a CPE.    HPI  Patient is in today for an annual exam. Patient is following up on his hypothyroidism, and other medical concerns. Patient has no acute concerns. No recent febrile illness or acute hospitalizations. Denies CP/palp/HA/congestion/fevers/GI or GU c/o. Taking meds as prescribed. He does note some worsening shortness of breath with exertion lately.    Patient Care Team: Bradd Canary, MD as PCP - General (Family Medicine) Reather Littler, MD as Consulting Physician (Endocrinology)   Past Medical History:  Diagnosis Date  . Allergic state 06/24/2015  . 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  . Environmental and seasonal allergies 06/24/2015  . H/O jaundice 10/26/2015   7 th grade caused by food, no recurrence  . History of influenza 2012   influenza A + ([facility])  . Hypothyroidism    Managed by endo ([facility])  . Inguinal hernia 06/24/2015  . Snoring 06/24/2015  . SOB (shortness of breath) 11/02/2016    Past Surgical History:  Procedure Laterality Date  . sleep study  01/2012   Home sleep study via [employer] NORM[facility]    Family History  Problem Relation Age of Onset  . Hypertension Mother   . Diabetes Mother   . Prostate cancer Father   . Cancer Father 65       prostate  . Other Son        bown with hydronephrosis, resolved with surgical intervention  . Thyroid disease Neg Hx     Social History   Social History  . Marital status: Married    Spouse name: N/A  . Number of children: N/A  . Years of education: N/A   Occupational History  . Not on file.   Social History Main Topics    . Smoking status: Never Smoker  . Smokeless tobacco: Never Used  . Alcohol use 0.6 oz/week    1 Shots of liquor per week     Comment: occasionally  . Drug use: No  . Sexual activity: Yes   Other Topics Concern  . Not on file   Social History Narrative   Married, 2 children    Master's degree in [place].  Emergency planning/management officer for [employer].   No tobacco, rare alcohol, no drug use.   Exercises regularly.  Typical american diet except avoids red meat.     Outpatient Medications Prior to Visit  Medication Sig Dispense Refill  . levothyroxine (SYNTHROID, LEVOTHROID) 100 MCG tablet Take 1 tablet (100 mcg total) by mouth daily. 90 tablet 3  . azithromycin (ZITHROMAX Z-PAK) 250 MG tablet As directed 6 tablet 0  . benzonatate (TESSALON PERLES) 100 MG capsule Take 1 capsule (100 mg total) by mouth 3 (three) times daily as needed for cough. 20 capsule 0  . benzonatate (TESSALON) 100 MG capsule Take 1 capsule (100 mg total) by mouth 3 (three) times daily as needed for cough. (Patient not taking: Reported on 05/16/2016) 30 capsule 0   No facility-administered medications prior to visit.     No Known Allergies  Review of Systems  Constitutional:  Negative for fever and malaise/fatigue.  HENT: Negative for congestion.   Eyes: Negative for blurred vision.  Respiratory: Negative for cough and shortness of breath.   Cardiovascular: Negative for chest pain, palpitations and leg swelling.  Gastrointestinal: Negative for vomiting.  Musculoskeletal: Negative for back pain.  Skin: Negative for rash.  Neurological: Negative for loss of consciousness and headaches.       Objective:    Physical Exam  Constitutional: He is oriented to person, place, and time. He appears well-developed and well-nourished. No distress.  HENT:  Head: Normocephalic and atraumatic.  Eyes: Conjunctivae are normal.  Neck: Normal range of motion. No thyromegaly present.  Cardiovascular: Normal rate and regular rhythm.    Pulmonary/Chest: Effort normal and breath sounds normal. He has no wheezes.  Abdominal: Soft. Bowel sounds are normal. There is no tenderness.  Musculoskeletal: Normal range of motion. He exhibits no edema or deformity.  Neurological: He is alert and oriented to person, place, and time.  Skin: Skin is warm and dry. He is not diaphoretic.  Psychiatric: He has a normal mood and affect.    BP 124/84 (BP Location: Left Arm, Patient Position: Sitting, Cuff Size: Large)   Pulse 88   Temp 98.1 F (36.7 C) (Oral)   Ht 5\' 9"  (1.753 m)   Wt 189 lb 9.6 oz (86 kg)   SpO2 98%   BMI 28.00 kg/m  Wt Readings from Last 3 Encounters:  10/30/16 189 lb 9.6 oz (86 kg)  05/16/16 185 lb (83.9 kg)  11/27/15 182 lb 2 oz (82.6 kg)   BP Readings from Last 3 Encounters:  10/30/16 124/84  05/16/16 120/76  11/27/15 98/72     Immunization History  Administered Date(s) Administered  . DTaP 10/22/2007  . Influenza-Unspecified 11/01/2013, 12/19/2014    Health Maintenance  Topic Date Due  . HIV Screening  10/28/1983  . TETANUS/TDAP  10/28/1987  . INFLUENZA VACCINE  10/08/2016    Lab Results  Component Value Date   WBC 5.8 10/29/2016   HGB 15.5 10/29/2016   HCT 45.5 10/29/2016   PLT 210.0 10/29/2016   GLUCOSE 89 10/29/2016   CHOL 196 10/29/2016   TRIG 104.0 10/29/2016   HDL 36.90 (L) 10/29/2016   LDLDIRECT 147.2 11/22/2010   LDLCALC 138 (H) 10/29/2016   ALT 18 10/29/2016   AST 17 10/29/2016   NA 140 10/29/2016   K 4.4 10/29/2016   CL 105 10/29/2016   CREATININE 1.06 10/29/2016   BUN 13 10/29/2016   CO2 30 10/29/2016   TSH 2.39 10/29/2016   PSA 0.99 10/29/2016    Lab Results  Component Value Date   TSH 2.39 10/29/2016   Lab Results  Component Value Date   WBC 5.8 10/29/2016   HGB 15.5 10/29/2016   HCT 45.5 10/29/2016   MCV 91.4 10/29/2016   PLT 210.0 10/29/2016   Lab Results  Component Value Date   NA 140 10/29/2016   K 4.4 10/29/2016   CO2 30 10/29/2016   GLUCOSE 89  10/29/2016   BUN 13 10/29/2016   CREATININE 1.06 10/29/2016   BILITOT 0.8 10/29/2016   ALKPHOS 58 10/29/2016   AST 17 10/29/2016   ALT 18 10/29/2016   PROT 6.6 10/29/2016   ALBUMIN 4.1 10/29/2016   CALCIUM 9.2 10/29/2016   GFR 79.25 10/29/2016   Lab Results  Component Value Date   CHOL 196 10/29/2016   Lab Results  Component Value Date   HDL 36.90 (L) 10/29/2016   Lab Results  Component Value Date   LDLCALC 138 (H) 10/29/2016   Lab Results  Component Value Date   TRIG 104.0 10/29/2016   Lab Results  Component Value Date   CHOLHDL 5 10/29/2016   No results found for: HGBA1C       Assessment & Plan:   Problem List Items Addressed This Visit    Hypothyroidism    On Levothyroxine, continue to monitor      Health maintenance examination    Patient encouraged to maintain heart healthy diet, regular exercise, adequate sleep. Consider daily probiotics. Take medications as prescribed      Pure hypercholesterolemia    Encouraged heart healthy diet, increase exercise, avoid trans fats, consider a krill oil cap daily      SOB (shortness of breath) - Primary    Most notably with exertion. Echocardiogram ordered but due to his poor insurance plan he will likely wait to proceed. He will notify us if his symptoms worsen.      Relevant Orders   ECHOCARDIOGRAM COMPLETE      I have discontinued Mr. Stuchell benzonatate, azithromycin, and benzonatate. I am also having him maintain his levothyroxine.  No orders of the defined types were placed in this encounter.   CMA served as Neurosurgeon during this visit. History, Physical and Plan performed by medical provider. Documentation and orders reviewed and attested to.  Danise Edge, MD

## 2016-10-30 NOTE — Patient Instructions (Signed)
Encouraged increased hydration and fiber in diet. Daily probiotics. If bowels not moving can use MOM 2 tbls po in 4 oz of warm prune juice by mouth every 2-3 days. If no results then repeat in 4 hours with  Dulcolax suppository pr, may repeat again in 4 more hours as needed. Seek care if symptoms worsen. Consider daily Miralax and/or Dulcolax if symptoms persist.  Preventive Care 40-64 Years, Male Preventive care refers to lifestyle choices and visits with your health care provider that can promote health and wellness. What does preventive care include?  A yearly physical exam. This is also called an annual well check.  Dental exams once or twice a year.  Routine eye exams. Ask your health care provider how often you should have your eyes checked.  Personal lifestyle choices, including: ? Daily care of your teeth and gums. ? Regular physical activity. ? Eating a healthy diet. ? Avoiding tobacco and drug use. ? Limiting alcohol use. ? Practicing safe sex. ? Taking low-dose aspirin every day starting at age 48. What happens during an annual well check? The services and screenings done by your health care provider during your annual well check will depend on your age, overall health, lifestyle risk factors, and family history of disease. Counseling Your health care provider may ask you questions about your:  Alcohol use.  Tobacco use.  Drug use.  Emotional well-being.  Home and relationship well-being.  Sexual activity.  Eating habits.  Work and work Statistician.  Screening You may have the following tests or measurements:  Height, weight, and BMI.  Blood pressure.  Lipid and cholesterol levels. These may be checked every 5 years, or more frequently if you are over 23 years old.  Skin check.  Lung cancer screening. You may have this screening every year starting at age 48 if you have a 30-pack-year history of smoking and currently smoke or have quit within the past 15  years.  Fecal occult blood test (FOBT) of the stool. You may have this test every year starting at age 48.  Flexible sigmoidoscopy or colonoscopy. You may have a sigmoidoscopy every 5 years or a colonoscopy every 10 years starting at age 48.  Prostate cancer screening. Recommendations will vary depending on your family history and other risks.  Hepatitis C blood test.  Hepatitis B blood test.  Sexually transmitted disease (STD) testing.  Diabetes screening. This is done by checking your blood sugar (glucose) after you have not eaten for a while (fasting). You may have this done every 1-3 years.  Discuss your test results, treatment options, and if necessary, the need for more tests with your health care provider. Vaccines Your health care provider may recommend certain vaccines, such as:  Influenza vaccine. This is recommended every year.  Tetanus, diphtheria, and acellular pertussis (Tdap, Td) vaccine. You may need a Td booster every 10 years.  Varicella vaccine. You may need this if you have not been vaccinated.  Zoster vaccine. You may need this after age 48.  Measles, mumps, and rubella (MMR) vaccine. You may need at least one dose of MMR if you were born in 1957 or later. You may also need a second dose.  Pneumococcal 13-valent conjugate (PCV13) vaccine. You may need this if you have certain conditions and have not been vaccinated.  Pneumococcal polysaccharide (PPSV23) vaccine. You may need one or two doses if you smoke cigarettes or if you have certain conditions.  Meningococcal vaccine. You may need this if you have  certain conditions.  Hepatitis A vaccine. You may need this if you have certain conditions or if you travel or work in places where you may be exposed to hepatitis A.  Hepatitis B vaccine. You may need this if you have certain conditions or if you travel or work in places where you may be exposed to hepatitis B.  Haemophilus influenzae type b (Hib) vaccine.  You may need this if you have certain risk factors.  Talk to your health care provider about which screenings and vaccines you need and how often you need them. This information is not intended to replace advice given to you by your health care provider. Make sure you discuss any questions you have with your health care provider. Document Released: 03/23/2015 Document Revised: 11/14/2015 Document Reviewed: 12/26/2014 Elsevier Interactive Patient Education  2017 [employer].

## 2016-10-30 NOTE — Assessment & Plan Note (Signed)
On Levothyroxine, continue to monitor 

## 2016-10-30 NOTE — Assessment & Plan Note (Signed)
Patient encouraged to maintain heart healthy diet, regular exercise, adequate sleep. Consider daily probiotics. Take medications as prescribed 

## 2016-10-31 ENCOUNTER — Encounter: Payer: Self-pay | Admitting: Family Medicine

## 2016-10-31 ENCOUNTER — Ambulatory Visit (HOSPITAL_BASED_OUTPATIENT_CLINIC_OR_DEPARTMENT_OTHER): Payer: Commercial Managed Care - PPO

## 2016-11-02 ENCOUNTER — Encounter: Payer: Self-pay | Admitting: Family Medicine

## 2016-11-02 DIAGNOSIS — R0602 Shortness of breath: Secondary | ICD-10-CM

## 2016-11-02 HISTORY — DX: Shortness of breath: R06.02

## 2016-11-02 NOTE — Assessment & Plan Note (Signed)
Encouraged heart healthy diet, increase exercise, avoid trans fats, consider a krill oil cap daily 

## 2016-11-02 NOTE — Assessment & Plan Note (Signed)
Most notably with exertion. Echocardiogram ordered but due to his poor insurance plan he will likely wait to proceed. He will notify us if his symptoms worsen.

## 2016-12-09 IMAGING — DX DG CHEST 2V
2 series · 2 of 2 positions shown · non-contrast
Comparison: None.

CLINICAL DATA: Nonproductive cough.

EXAM:
CHEST  2 VIEW

[chest pa]
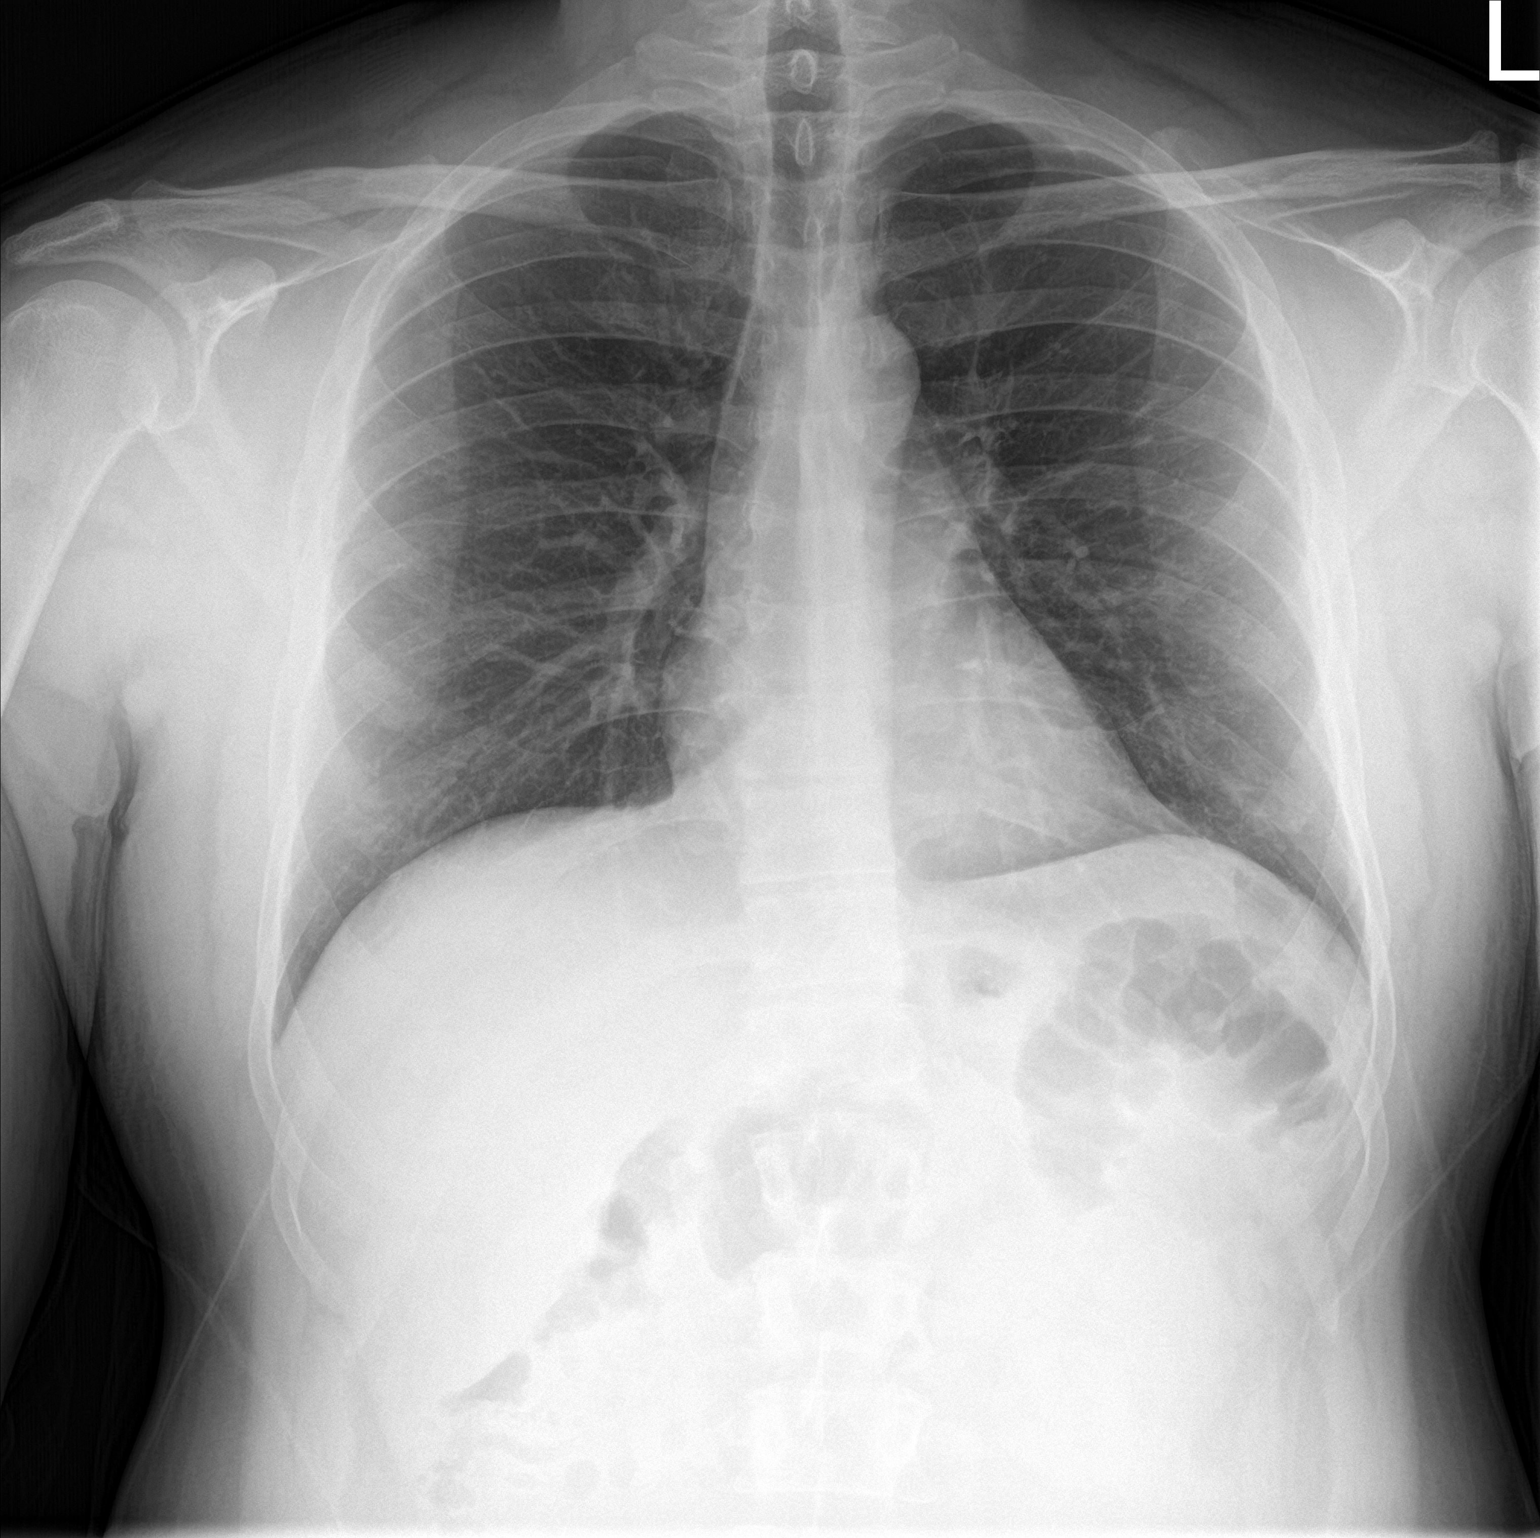

[chest lat]
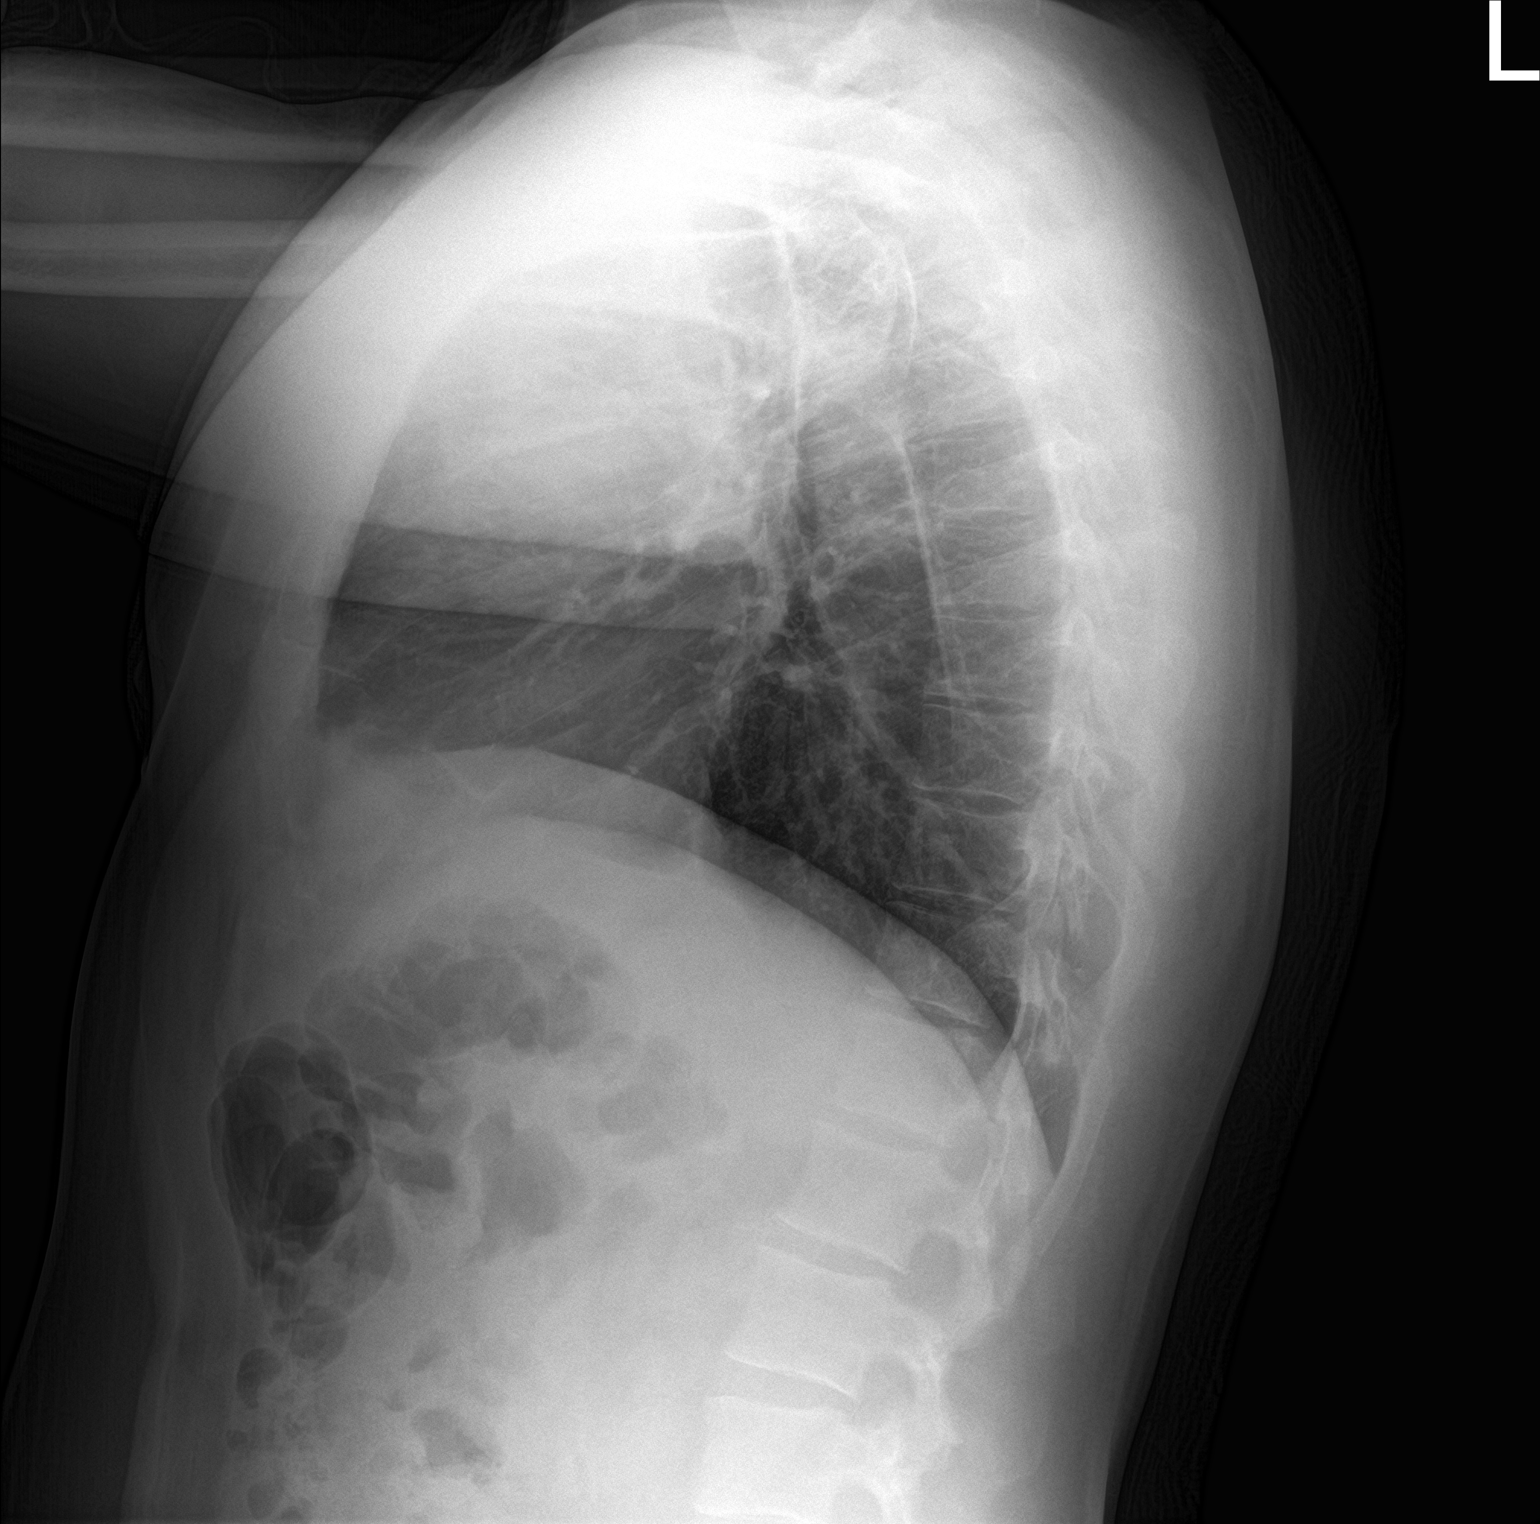

[2 of 2 positions shown; findings below may reference images not displayed]

FINDINGS: Mediastinum and hilar structures normal. Mild left base subsegmental
atelectasis. Heart size normal. No pleural effusion pneumothorax. No
acute bony abnormality .
IMPRESSION: Mild left base subsegmental atelectasis.

## 2017-05-18 ENCOUNTER — Other Ambulatory Visit: Payer: Self-pay | Admitting: Endocrinology

## 2017-05-18 ENCOUNTER — Other Ambulatory Visit (INDEPENDENT_AMBULATORY_CARE_PROVIDER_SITE_OTHER): Payer: Commercial Managed Care - PPO

## 2017-05-18 DIAGNOSIS — E063 Autoimmune thyroiditis: Secondary | ICD-10-CM | POA: Diagnosis not present

## 2017-05-18 LAB — T4, FREE: FREE T4: 1.01 ng/dL (ref 0.60–1.60)

## 2017-05-18 LAB — TSH: TSH: 4.77 u[IU]/mL — ABNORMAL HIGH (ref 0.35–4.50)

## 2017-05-22 ENCOUNTER — Ambulatory Visit: Payer: Commercial Managed Care - PPO | Admitting: Endocrinology

## 2017-05-22 ENCOUNTER — Encounter: Payer: Self-pay | Admitting: Endocrinology

## 2017-05-22 VITALS — BP 120/88 | HR 72 | Ht 69.0 in | Wt 188.0 lb

## 2017-05-22 DIAGNOSIS — E782 Mixed hyperlipidemia: Secondary | ICD-10-CM | POA: Diagnosis not present

## 2017-05-22 DIAGNOSIS — E063 Autoimmune thyroiditis: Secondary | ICD-10-CM | POA: Diagnosis not present

## 2017-05-22 NOTE — Progress Notes (Signed)
Patient ID: Bruce [name], male   DOB: 19-Jul-1970, 47 y.o.   MRN: [id number]  Reason for Appointment:  Hypothyroidism, followup visit    History of Present Illness:   The hypothyroidism was first diagnosed in 2012  Initially had significant nonspecific fatigue and some cold intolerance.  No details are available about the initial diagnosis but his TSH in 2012 was 5.3  The patient has been treated with 100 mcg daily  The patient is taking the thyroid supplement usually regularly in the morning before breakfast.   However he finally did remember that he forgot his medication on a trip for 4 days about a week to 10 days before his last visit At times he does complain of some fatigue but no cold intolerance His weight is about the same  He is  taking generic levothyroxine instead of brand name because of difference in cost, getting this from [facility]  and is getting [facility] brand   His TSH is now slightly above normal   Lab Results  Component Value Date   TSH 4.77 (H) 05/18/2017   TSH 2.39 10/29/2016   TSH 2.02 05/13/2016   FREET4 1.01 05/18/2017   FREET4 1.09 05/13/2016   FREET4 1.17 05/08/2015      Allergies as of 05/22/2017   No Known Allergies     Medication List        Accurate as of 05/22/17  8:47 AM. Always use your most recent med list.          levothyroxine 100 MCG tablet Commonly known as:  SYNTHROID, LEVOTHROID Take 1 tablet (100 mcg total) by mouth daily.       Allergies: No Known Allergies  Past Medical History:  Diagnosis Date  . Allergic state 06/24/2015  . 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  . Environmental and seasonal allergies 06/24/2015  . H/O jaundice 10/26/2015   7 th grade caused by food, no recurrence  . History of influenza 2012   influenza A + ([facility])  . Hypothyroidism     Managed by endo ([facility])  . Inguinal hernia 06/24/2015  . Snoring 06/24/2015  . SOB (shortness of breath) 11/02/2016    Past Surgical History:  Procedure Laterality Date  . sleep study  01/2012   Home sleep study via [employer] [facility]    Family History  Problem Relation Age of Onset  . Hypertension Mother   . Diabetes Mother   . Prostate cancer Father   . Cancer Father 28       prostate  . Other Son        bown with hydronephrosis, resolved with surgical intervention  . Thyroid disease Neg Hx     Social History:  reports that  has never smoked. he has never used smokeless tobacco. He reports that he drinks about 0.6 oz of alcohol per week. He reports that he does not use drugs.  REVIEW Of SYSTEMS:  Not exercising much recently   Wt Readings from Last 3 Encounters:  05/22/17 188 lb (85.3 kg)  10/30/16 189 lb 9.6 oz (86 kg)  05/16/16 185 lb (83.9 kg)    GLUCOSE level was 89 fasting No recent A1c available, has family history of diabetes   He does appear to have persistent dyslipidemia, does have family history of diabetes He does not think he  has a large amount of animal fat or dairy fat in his diet or sweets like ice cream  Lab Results  Component Value Date   CHOL 196 10/29/2016   HDL 36.90 (L) 10/29/2016   LDLCALC 138 (H) 10/29/2016   LDLDIRECT 147.2 11/22/2010   TRIG 104.0 10/29/2016   CHOLHDL 5 10/29/2016      Examination:   BP 120/88 (BP Location: Left Arm, Patient Position: Sitting, Cuff Size: Normal)   Pulse 72   Ht 5\' 9"  (1.753 m)   Wt 188 lb (85.3 kg)   SpO2 99%   BMI 27.76 kg/m     NECK: Thyroid is not palpable             Assessment   Hypothyroidism, primary and long-standing  Although he is feeling intermittently fatigued his TSH being slightly high may be related to his missing his medication in the couple of weeks prior to this lab work date He still gets the same brand of generic levothyroxine  HYPERLIPIDEMIA: He does have  risk factors with his ethnic background and having a low HDL, ideally his LDL should be below 100  Advised him to follow-up with his PCP for symptoms of shortness of breath on exertion and some burning in his feet    Treatment:   Continue same dosage before breakfast daily  However will follow-up in 2 months again to make sure his TSH is back to normal with regular medication  We will check lipoprotein profile and lipids on the next visit and if he has significantly high LDL particle number we will consider a statin drug for cardiovascular protection, discussed how this is different than a lipid panel  Reather LittlerAjay Alfonza Toft 05/22/2017, 8:47 AM

## 2017-06-15 ENCOUNTER — Other Ambulatory Visit: Payer: Self-pay | Admitting: Endocrinology

## 2017-06-16 ENCOUNTER — Telehealth: Payer: Self-pay | Admitting: Endocrinology

## 2017-06-16 NOTE — Telephone Encounter (Signed)
Patient stated the pharmacy have not received his prescription for Levothyroxine.   Pharmacy:  [facility] [address] - [place], [place] - [address]

## 2017-06-16 NOTE — Telephone Encounter (Signed)
Spoke with patient and informed him that I spoke with pharmacy on file and verified that script was received. Pt verbalized understanding.

## 2017-08-13 DIAGNOSIS — H40013 Open angle with borderline findings, low risk, bilateral: Secondary | ICD-10-CM | POA: Diagnosis not present

## 2017-08-19 ENCOUNTER — Telehealth: Payer: Self-pay | Admitting: Endocrinology

## 2017-08-19 NOTE — Telephone Encounter (Signed)
error 

## 2017-08-20 ENCOUNTER — Telehealth: Payer: Self-pay | Admitting: Endocrinology

## 2017-08-20 ENCOUNTER — Other Ambulatory Visit: Payer: Commercial Managed Care - PPO

## 2017-08-20 NOTE — Telephone Encounter (Signed)
The pt would like the lab requisition faxed to his place of work, [employer], [employer]. He stated that there is a nurse at his office that can do the lab work and he also states that this has been done before. Is this okay to send to a place of work?

## 2017-08-20 NOTE — Telephone Encounter (Signed)
Patient want to know if his blood test can be sent to his PCP Bradd CanaryBlyth, Stacey A, MD Phone: [phone]; Fax: [phone]

## 2017-08-20 NOTE — Telephone Encounter (Signed)
Lab requisitions faxed to number listed below.

## 2017-08-21 ENCOUNTER — Other Ambulatory Visit (INDEPENDENT_AMBULATORY_CARE_PROVIDER_SITE_OTHER): Payer: Commercial Managed Care - PPO

## 2017-08-21 DIAGNOSIS — E782 Mixed hyperlipidemia: Secondary | ICD-10-CM

## 2017-08-21 DIAGNOSIS — E063 Autoimmune thyroiditis: Secondary | ICD-10-CM

## 2017-08-21 LAB — LIPID PANEL
CHOL/HDL RATIO: 5
Cholesterol: 209 mg/dL — ABNORMAL HIGH (ref 0–200)
HDL: 38.9 mg/dL — ABNORMAL LOW (ref >39.00)
LDL Cholesterol: 153 mg/dL — ABNORMAL HIGH (ref 0–99)
NonHDL: 170.54
Triglycerides: 89 mg/dL (ref 0.0–149.0)
VLDL: 17.8 mg/dL (ref 0.0–40.0)

## 2017-08-21 LAB — TSH: TSH: 1.94 u[IU]/mL (ref 0.35–4.50)

## 2017-08-21 LAB — T4, FREE: Free T4: 1.05 ng/dL (ref 0.60–1.60)

## 2017-08-21 NOTE — Telephone Encounter (Signed)
Pt has been placed on lab schedule. 

## 2017-08-22 LAB — LIPOPROTEIN ANALYSIS BY NMR
HDL Particle Number: 27.8 umol/L — ABNORMAL LOW (ref >=30.5)
LDL PARTICLE NUMBER: 1714 nmol/L — AB (ref <1000)
LDL SIZE: 21 nm (ref >20.5)
LP-IR Score: 42 (ref <=45)
SMALL LDL PARTICLE NUMBER: 845 nmol/L — AB (ref <=527)

## 2017-08-24 ENCOUNTER — Ambulatory Visit: Payer: Commercial Managed Care - PPO | Admitting: Endocrinology

## 2017-08-24 ENCOUNTER — Encounter: Payer: Self-pay | Admitting: Endocrinology

## 2017-08-24 VITALS — BP 122/78 | HR 68 | Ht 69.0 in | Wt 185.2 lb

## 2017-08-24 DIAGNOSIS — E782 Mixed hyperlipidemia: Secondary | ICD-10-CM | POA: Diagnosis not present

## 2017-08-24 DIAGNOSIS — E063 Autoimmune thyroiditis: Secondary | ICD-10-CM

## 2017-08-24 MED ORDER — ROSUVASTATIN CALCIUM 5 MG PO TABS: 5.0000 mg | ORAL_TABLET | Freq: Every day | ORAL | 3 refills | Status: DC

## 2017-08-24 NOTE — Progress Notes (Signed)
Patient ID: Bruce [name], male   DOB: May 28, 1970, 47 y.o.   MRN: [id number]   Reason for Appointment:  Hypothyroidism and hyperlipidemia, followup visit    History of Present Illness:   The hypothyroidism was first diagnosed in 2012  Initially had significant nonspecific fatigue and some cold intolerance.  No details are available about the initial diagnosis but his TSH in 2012 was 5.3  The patient has been treated with 100 mcg daily  The patient is taking the thyroid supplement usually regularly in the morning before breakfast.    However he has been a little irregular with his supplement prior to his last visit and now with taking this regularly his TSH is back to normal  At times he does complain of some fatigue as before consistently  He is  taking generic levothyroxine instead of brand name because of difference in cost, getting this from [facility]   His TSH is now  normal   Lab Results  Component Value Date   TSH 1.94 08/21/2017   TSH 4.77 (H) 05/18/2017   TSH 2.39 10/29/2016   FREET4 1.05 08/21/2017   FREET4 1.01 05/18/2017   FREET4 1.09 05/13/2016   LIPIDS: See review of systems   Allergies as of 08/24/2017   No Known Allergies     Medication List        Accurate as of 08/24/17  9:02 AM. Always use your most recent med list.          levothyroxine 100 MCG tablet Commonly known as:  SYNTHROID, LEVOTHROID 1 tablet before breakfast daily       Allergies: No Known Allergies  Past Medical History:  Diagnosis Date  . Allergic state 06/24/2015  . 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  . Environmental and seasonal allergies 06/24/2015  . H/O jaundice 10/26/2015   7 th grade caused by food, no recurrence  . History of influenza 2012   influenza A + ([facility])  . Hypothyroidism    Managed by endo  ([facility])  . Inguinal hernia 06/24/2015  . Snoring 06/24/2015  . SOB (shortness of breath) 11/02/2016    Past Surgical History:  Procedure Laterality Date  . sleep study  01/2012   Home sleep study via [employer] [facility]    Family History  Problem Relation Age of Onset  . Hypertension Mother   . Diabetes Mother   . Prostate cancer Father   . Cancer Father 81       prostate  . Other Son        bown with hydronephrosis, resolved with surgical intervention  . Thyroid disease Neg Hx   . Heart disease Neg Hx     Social History:  reports that he has never smoked. He has never used smokeless tobacco. He reports that he drinks about 0.6 oz of alcohol per week. He reports that he does not use drugs.  REVIEW Of SYSTEMS:   GLUCOSE level was 89 fasting No recent A1c available, has family history of diabetes  HYPERLIPIDEMIA: He does appear to have persistent dyslipidemia, also appears to have family history of diabetes  Prior treatment is none Current evaluation with LDL particle assessment shows this to be markedly increased at about 1700 with LDL size just above lower limit  He does not think he has been eating red meat  or large amount of regular dairy products He is not very keen on taking statin drugs but explained to him the benefits of this with his marked hypercholesterolemia and LDL particle number about [id number] above target    Lab Results  Component Value Date   CHOL 209 (H) 08/21/2017   HDL 38.90 (L) 08/21/2017   LDLCALC 153 (H) 08/21/2017   LDLDIRECT 147.2 11/22/2010   TRIG 89.0 08/21/2017   CHOLHDL 5 08/21/2017      Examination:   BP 122/78 (BP Location: Left Arm, Patient Position: Sitting, Cuff Size: Normal)   Pulse 68   Ht 5\' 9"  (1.753 m)   Wt 185 lb 3.2 oz (84 kg)   SpO2 98%   BMI 27.35 kg/m              Assessment   Hypothyroidism, primary and long-standing  Currently doing fairly well subjectively with levothyroxine and with his taking his  medication regularly now he is having good control  He can continue the generic version currently significantly high LDL particle number as discussed above along with  HYPERLIPIDEMIA: He does have risk factors with his ethnic background and having a low HDL  explained to him LDL particle number targets He agrees to start 5 mg of Crestor   Advised him to follow-up with his PCP for symptoms of shortness of breath on exertion which may be possibly asthma    Treatment:  As above Follow-up in 3 months for reassessment of lipids  Reather LittlerAjay Elio Haden 08/24/2017, 9:02 AM

## 2017-11-24 LAB — TSH: TSH: 4.9 (ref 0.41–5.90)

## 2017-11-24 LAB — LIPID PANEL
Cholesterol: 146 (ref 0–200)
HDL: 41 (ref 35–70)
LDL Cholesterol: 91
Triglycerides: 72 (ref 40–160)

## 2017-11-27 ENCOUNTER — Ambulatory Visit: Payer: Commercial Managed Care - PPO | Admitting: Endocrinology

## 2017-11-27 ENCOUNTER — Encounter: Payer: Self-pay | Admitting: Endocrinology

## 2017-11-27 VITALS — BP 110/70 | HR 72 | Temp 98.6°F | Ht 69.0 in | Wt 179.0 lb

## 2017-11-27 DIAGNOSIS — E063 Autoimmune thyroiditis: Secondary | ICD-10-CM

## 2017-11-27 DIAGNOSIS — E782 Mixed hyperlipidemia: Secondary | ICD-10-CM | POA: Diagnosis not present

## 2017-11-27 MED ORDER — LEVOTHYROXINE SODIUM 112 MCG PO TABS: 112.0000 ug | ORAL_TABLET | Freq: Every day | ORAL | 3 refills | Status: DC

## 2017-11-27 NOTE — Patient Instructions (Signed)
Extra 1/2 weekly of 100 dose

## 2017-11-27 NOTE — Progress Notes (Signed)
Patient ID: Bruce [name], male   DOB: 1971-01-14, 47 y.o.   MRN: [id number]   Reason for Appointment:  Hypothyroidism and hyperlipidemia, followup visit    History of Present Illness:   The hypothyroidism was first diagnosed in 2012  Initially had significant nonspecific fatigue and some cold intolerance.  No details are available about the initial diagnosis but his TSH in 2012 was 5.3  The patient has been treated with 100 mcg daily of the generic levothyroxine  The patient is taking the thyroid supplement regularly in the morning before breakfast.   Although in 3/19 his TSH was high when he was occasionally irregular with his levothyroxine he is very consistent with this lately Does not think he has had any any unusual fatigue He has been trying to lose weight with a Slim fast diet and more exercise  His TSH is now high at 4.9 done from outside lab  Wt Readings from Last 3 Encounters:  11/27/17 179 lb (81.2 kg)  08/24/17 185 lb 3.2 oz (84 kg)  05/22/17 188 lb (85.3 kg)    Lab Results  Component Value Date   TSH 1.94 08/21/2017   TSH 4.77 (H) 05/18/2017   TSH 2.39 10/29/2016   FREET4 1.05 08/21/2017   FREET4 1.01 05/18/2017   FREET4 1.09 05/13/2016   LIPIDS: See review of systems   Allergies as of 11/27/2017   No Known Allergies     Medication List        Accurate as of 11/27/17  8:40 AM. Always use your most recent med list.          levothyroxine 100 MCG tablet Commonly known as:  SYNTHROID, LEVOTHROID 1 tablet before breakfast daily   rosuvastatin 5 MG tablet Commonly known as:  CRESTOR Take 1 tablet (5 mg total) by mouth daily.       Allergies: No Known Allergies  Past Medical History:  Diagnosis Date  . Allergic state 06/24/2015  . 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  . Environmental and  seasonal allergies 06/24/2015  . H/O jaundice 10/26/2015   7 th grade caused by food, no recurrence  . History of influenza 2012   influenza A + ([facility])  . Hypothyroidism    Managed by endo ([facility])  . Inguinal hernia 06/24/2015  . Snoring 06/24/2015  . SOB (shortness of breath) 11/02/2016    Past Surgical History:  Procedure Laterality Date  . sleep study  01/2012   Home sleep study via [employer] [facility]    Family History  Problem Relation Age of Onset  . Hypertension Mother   . Diabetes Mother   . Prostate cancer Father   . Cancer Father 10       prostate  . Other Son        bown with hydronephrosis, resolved with surgical intervention  . Thyroid disease Neg Hx   . Heart disease Neg Hx     Social History:  reports that he has never smoked. He has never used smokeless tobacco. He reports that he drinks about 1.0 standard drinks of alcohol per week. He reports that he does not use drugs.  REVIEW Of SYSTEMS:   GLUCOSE level was 89 fasting previously No recent A1c available, has family history of diabetes  HYPERLIPIDEMIA: He has had dyslipidemia, with high LDL and  low HDL  Previous evaluation with LDL particle assessment shows this to be markedly increased at about 1700 with LDL size just above lower limit  Because of his high LDL particle number and ethnic status he has been started on Crestor He has also lost weight with a Slim fast diet and is exercising LDL is now 91 from outside lab   Lab Results  Component Value Date   CHOL 209 (H) 08/21/2017   HDL 38.90 (L) 08/21/2017   LDLCALC 153 (H) 08/21/2017   LDLDIRECT 147.2 11/22/2010   TRIG 89.0 08/21/2017   CHOLHDL 5 08/21/2017      Examination:   BP 110/70 (BP Location: Left Arm, Patient Position: Sitting, Cuff Size: Large)   Pulse 72   Temp 98.6 F (37 C) (Oral)   Ht 5\' 9"  (1.753 m)   Wt 179 lb (81.2 kg)   BMI 26.43 kg/m           Thyroid not palpable Biceps reflexes appear  normal    Assessment   Hypothyroidism, primary and long-standing  Although he is doing fairly well subjectively with levothyroxine 100 mcg his TSH is now 4.9 This is despite taking his supplement daily regularly now Does not have any unusual fatigue currently Thyroid not palpable  HYPERLIPIDEMIA: He is on 5 mg Crestor and his LDL is below 100 now Discussed that he does need to continue this since the LDL will be significantly increased if he stops the medication Also his LDL particle number was significantly high and needs pharmacological treatment long-term   Treatment:  Synthroid 112 mcg daily In the meantime he can use of the 100 mcg with taking 7-1/2 tablets a week  Continue Crestor  Reather LittlerAjay Livana Yerian 11/27/2017, 8:40 AM

## 2017-11-30 ENCOUNTER — Encounter: Payer: Self-pay | Admitting: Endocrinology

## 2017-12-29 ENCOUNTER — Telehealth: Payer: Self-pay | Admitting: Endocrinology

## 2017-12-29 ENCOUNTER — Other Ambulatory Visit: Payer: Self-pay | Admitting: Endocrinology

## 2017-12-29 NOTE — Telephone Encounter (Signed)
°*  STAT* If patient is at the pharmacy, call can be transferred to refill team.   1. Which medications need to be refilled? (please list name of each medication and dose if known)  Crestor 5mg  Levothyroxine 112mg   2. Which pharmacy/location (including street and city if local pharmacy) is medication to be sent to?[facility]  3. Do they need a 30 day or 90 day supply? 90 day

## 2017-12-30 NOTE — Telephone Encounter (Signed)
Pt was instructed to contact his pharmacy where he should have refills on file

## 2018-03-08 ENCOUNTER — Encounter: Payer: Commercial Managed Care - PPO | Admitting: Family Medicine

## 2018-04-19 ENCOUNTER — Other Ambulatory Visit: Payer: Self-pay | Admitting: Endocrinology

## 2018-04-19 ENCOUNTER — Telehealth: Payer: Self-pay | Admitting: Endocrinology

## 2018-04-19 NOTE — Telephone Encounter (Signed)
Error

## 2018-05-13 ENCOUNTER — Encounter: Payer: Commercial Managed Care - PPO | Admitting: Family Medicine

## 2018-05-18 ENCOUNTER — Other Ambulatory Visit: Payer: Self-pay

## 2018-05-18 ENCOUNTER — Telehealth: Payer: Self-pay | Admitting: Endocrinology

## 2018-05-18 MED ORDER — ROSUVASTATIN CALCIUM 5 MG PO TABS: 5.0000 mg | ORAL_TABLET | Freq: Every day | ORAL | 3 refills | Status: DC

## 2018-05-18 NOTE — Telephone Encounter (Signed)
MEDICATION: rosuvastatin (CRESTOR) 5 MG tablet  PHARMACY:  [facility]  IS THIS A 90 DAY SUPPLY : Yes  IS PATIENT OUT OF MEDICATION:   IF NOT; HOW MUCH IS LEFT: 3-4  LAST APPOINTMENT DATE: @2 /12/2018  NEXT APPOINTMENT DATE:@3 /20/2020  DO WE HAVE YOUR PERMISSION TO LEAVE A DETAILED MESSAGE:  OTHER COMMENTS:    **Let patient know to contact pharmacy at the end of the day to make sure medication is ready. **  ** Please notify patient to allow 48-72 hours to process**  **Encourage patient to contact the pharmacy for refills or they can request refills through [facility]**

## 2018-05-18 NOTE — Telephone Encounter (Signed)
Rx sent 

## 2018-05-28 ENCOUNTER — Ambulatory Visit: Payer: Commercial Managed Care - PPO | Admitting: Endocrinology

## 2018-06-15 ENCOUNTER — Other Ambulatory Visit: Payer: Self-pay

## 2018-06-15 ENCOUNTER — Telehealth: Payer: Self-pay | Admitting: Endocrinology

## 2018-06-15 MED ORDER — LEVOTHYROXINE SODIUM 112 MCG PO TABS: 112.0000 ug | ORAL_TABLET | Freq: Every day | ORAL | 0 refills | Status: DC

## 2018-06-15 MED ORDER — ROSUVASTATIN CALCIUM 5 MG PO TABS: 5.0000 mg | ORAL_TABLET | Freq: Every day | ORAL | 1 refills | Status: DC

## 2018-06-15 NOTE — Telephone Encounter (Signed)
MEDICATION: Levothyroxine and Rosuvastatin  PHARMACY:  [facility]  IS THIS A 90 DAY SUPPLY : patient would like a 90 day RX instead of a 30 day RX  IS PATIENT OUT OF MEDICATION:   IF NOT; HOW MUCH IS LEFT:   LAST APPOINTMENT DATE: @3 /12/2018  NEXT APPOINTMENT DATE:@6 /19/2020  DO WE HAVE YOUR PERMISSION TO LEAVE A DETAILED MESSAGE: YES  OTHER COMMENTS:    **Let patient know to contact pharmacy at the end of the day to make sure medication is ready. **  ** Please notify patient to allow 48-72 hours to process**  **Encourage patient to contact the pharmacy for refills or they can request refills through [facility]**

## 2018-06-15 NOTE — Telephone Encounter (Signed)
Rx sent 

## 2018-06-22 ENCOUNTER — Telehealth: Payer: Self-pay

## 2018-06-22 NOTE — Telephone Encounter (Signed)
PA initiated via [email or web address] for Synthroid tablets. KeyRoney Jaffe   PA Case ID: [id number]   Rx #: [id number]

## 2018-08-27 ENCOUNTER — Ambulatory Visit: Payer: Commercial Managed Care - PPO | Admitting: Endocrinology

## 2018-08-31 ENCOUNTER — Telehealth: Payer: Self-pay | Admitting: Endocrinology

## 2018-08-31 NOTE — Telephone Encounter (Signed)
Patient called requesting that we place his orders in MyChart to be able to get his labs done at his lab before his visit.  Please Advise, Thanks

## 2018-08-31 NOTE — Telephone Encounter (Signed)
Already set for [facility] to see

## 2018-09-07 NOTE — Telephone Encounter (Signed)
Please advise 

## 2018-09-13 LAB — TSH: TSH: 0.85 (ref 0.41–5.90)

## 2018-09-13 LAB — HEMOGLOBIN A1C: Hemoglobin A1C: 5.3

## 2018-09-13 LAB — BASIC METABOLIC PANEL: Glucose: 91

## 2018-09-13 LAB — CBC AND DIFFERENTIAL: Hemoglobin: 17 (ref 13.5–17.5)

## 2018-09-14 LAB — LIPID PANEL
LDL Cholesterol: 95
Triglycerides: 109 (ref 40–160)

## 2018-09-16 NOTE — Progress Notes (Signed)
Patient ID: Bruce SeltzerSharan [name], male   DOB: 1970/03/12, 48 y.o.   MRN: [id number]   Reason for Appointment:  Hypothyroidism and hyperlipidemia, followup visit  Today's office visit was provided via telemedicine using video technique The patient was explained the limitations of evaluation and management by telemedicine and the availability of in person appointments.  The patient understood the limitations and agreed to proceed. Patient also understood that the telehealth visit is billable. . Location of the patient: Patient's home . Location of the provider: Physician office Only the patient and myself were participating in the encounter      History of Present Illness:   The hypothyroidism was first diagnosed in 2012  Initially had significant nonspecific fatigue and some cold intolerance.  No details are available about the initial diagnosis but his TSH in 2012 was 5.3  The patient has been treated with 100 mcg daily of the generic levothyroxine  The patient is taking the thyroid supplement regularly in the morning before breakfast.   Previously in 3/19 his TSH was high when he was occasionally irregular with his levothyroxine he is very consistent with this subsequently Again in 9/19 his TSH was relatively high at 4.9 Since then he has been taking 112 mcg levothyroxine  He does not think he felt any different with the dosage increase last year He really feels fairly good with his energy level He thinks his weight is about the same at 180  TSH is normal at 0.85  Wt Readings from Last 3 Encounters:  11/27/17 179 lb (81.2 kg)  08/24/17 185 lb 3.2 oz (84 kg)  05/22/17 188 lb (85.3 kg)    Lab Results  Component Value Date   TSH 0.85 09/13/2018   TSH 4.90 11/24/2017   TSH 1.94 08/21/2017   FREET4 1.05 08/21/2017   FREET4 1.01 05/18/2017   FREET4 1.09 05/13/2016   LIPIDS: See review of systems   Allergies as of 09/17/2018   No Known Allergies     Medication  List       Accurate as of September 17, 2018 11:59 PM. If you have any questions, ask your nurse or doctor.        levothyroxine 112 MCG tablet Commonly known as: SYNTHROID Take 1 tablet (112 mcg total) by mouth daily.   rosuvastatin 5 MG tablet Commonly known as: CRESTOR Take 1 tablet (5 mg total) by mouth daily.       Allergies: No Known Allergies  Past Medical History:  Diagnosis Date  . Allergic state 06/24/2015  . 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  . Environmental and seasonal allergies 06/24/2015  . H/O jaundice 10/26/2015   7 th grade caused by food, no recurrence  . History of influenza 2012   influenza A + ([facility])  . Hypothyroidism    Managed by endo ([facility])  . Inguinal hernia 06/24/2015  . Snoring 06/24/2015  . SOB (shortness of breath) 11/02/2016    Past Surgical History:  Procedure Laterality Date  . sleep study  01/2012   Home sleep study via [employer] NORMAL    Family History  Problem Relation Age of Onset  . Hypertension Mother   . Diabetes Mother   . Prostate cancer Father   . Cancer Father 660       prostate  . Other Son  bown with hydronephrosis, resolved with surgical intervention  . Thyroid disease Neg Hx   . Heart disease Neg Hx     Social History:  reports that he has never smoked. He has never used smokeless tobacco. He reports current alcohol use of about 1.0 standard drinks of alcohol per week. He reports that he does not use drugs.  REVIEW Of SYSTEMS:   GLUCOSE level has been normal A1c 5.3 He has has family history of diabetes  Lab Results  Component Value Date   HGBA1C 5.3 09/13/2018   Lab Results  Component Value Date   [place] 95 09/13/2018   CREATININE 1.06 10/29/2016     HYPERLIPIDEMIA: He has had dyslipidemia, with high LDL and low HDL  Previous  evaluation with LDL particle assessment shows this to be markedly increased at about 1700 with LDL size just above lower limit  Because of his high LDL particle number and ethnic status he has been started on Crestor  He takes 5 mg Crestor without any side effects Diet is usually good He tries to walk for exercise LDL is below 100 consistently Liver function is also normal   Lab Results  Component Value Date   CHOL 146 11/24/2017   HDL 41 11/24/2017   LDLCALC 95 09/13/2018   LDLDIRECT 147.2 11/22/2010   TRIG 109 09/13/2018   CHOLHDL 5 08/21/2017      Examination:   There were no vitals taken for this visit.             Assessment   Hypothyroidism, primary and long-standing  He is taking 112 mcg levothyroxine since 9/19 Has been consistent with taking this daily before breakfast Subjectively doing well and TSH is 0.85  HYPERLIPIDEMIA: He has a baseline high LDL particle number He is on 5 mg Crestor and his LDL is below 100 consistently   Treatment:  Continue Synthroid 112 mcg daily   Continue Crestor 5 mg daily, reminded him about benefits of long-term cardiovascular risk reduction  Follow-up annually now  Elayne Snare 09/18/2018, 4:37 PM

## 2018-09-17 ENCOUNTER — Ambulatory Visit (INDEPENDENT_AMBULATORY_CARE_PROVIDER_SITE_OTHER): Payer: Commercial Managed Care - PPO | Admitting: Endocrinology

## 2018-09-17 ENCOUNTER — Encounter: Payer: Self-pay | Admitting: Endocrinology

## 2018-09-17 ENCOUNTER — Other Ambulatory Visit: Payer: Self-pay

## 2018-09-17 DIAGNOSIS — E782 Mixed hyperlipidemia: Secondary | ICD-10-CM

## 2018-09-17 DIAGNOSIS — E063 Autoimmune thyroiditis: Secondary | ICD-10-CM | POA: Diagnosis not present

## 2018-09-17 MED ORDER — ROSUVASTATIN CALCIUM 5 MG PO TABS: 5.0000 mg | ORAL_TABLET | Freq: Every day | ORAL | 2 refills | Status: DC

## 2018-09-17 MED ORDER — LEVOTHYROXINE SODIUM 112 MCG PO TABS: 112.0000 ug | ORAL_TABLET | Freq: Every day | ORAL | 2 refills | Status: DC

## 2018-09-18 NOTE — Addendum Note (Signed)
Addended by: Elayne Snare on: 09/18/2018 04:42 PM   Modules accepted: Orders

## 2018-12-20 ENCOUNTER — Other Ambulatory Visit: Payer: Commercial Managed Care - PPO

## 2018-12-23 ENCOUNTER — Ambulatory Visit: Payer: Commercial Managed Care - PPO | Admitting: Endocrinology

## 2019-01-28 ENCOUNTER — Ambulatory Visit (INDEPENDENT_AMBULATORY_CARE_PROVIDER_SITE_OTHER): Payer: Commercial Managed Care - PPO | Admitting: Medical

## 2019-01-28 ENCOUNTER — Encounter: Payer: Self-pay | Admitting: Medical

## 2019-01-28 ENCOUNTER — Other Ambulatory Visit: Payer: Self-pay

## 2019-01-28 DIAGNOSIS — R591 Generalized enlarged lymph nodes: Secondary | ICD-10-CM | POA: Diagnosis not present

## 2019-01-28 MED ORDER — CEPHALEXIN 500 MG PO CAPS: 500.0000 mg | ORAL_CAPSULE | Freq: Two times a day (BID) | ORAL | 0 refills | Status: DC

## 2019-01-28 NOTE — Patient Instructions (Signed)
By your desccription on exam concern for enlarged rt submandibular node. I do think best to prescribe keflex antibiotic and follow you closely. Need to do actual physical exam in 10 days or sooner if needed.  Need to determine if source of infection present. Also need to confirm if lymph node is swollen. Might get cbc or possible imaging studies if needed.  Follow up 10 days or as needed.  Rx antibiotic as we approach weekend and upcoming short holiday week.

## 2019-01-28 NOTE — Progress Notes (Signed)
   Subjective:    Patient ID: Bruce [name], male    DOB: 04/20/1970, 48 y.o.   MRN: [id number]  HPI  Virtual Visit via Video Note  I connected with Vickii Chafe on 01/28/19 at  3:40 PM EST by a video enabled telemedicine application and verified that I am speaking with the correct person using two identifiers.  Location: Patient: home  Provider: office  No vitals today or temp.   I discussed the limitations of evaluation and management by telemedicine and the availability of in person appointments. The patient expressed understanding and agreed to proceed.  History of Present Illness:  Pt has some rt submandibular node region mild swelling. Pt states first felt on Wednesday night. If he presses on the area it is little tender.  Pt thinks see small ulcer base of tongue on rt side. Does not chew tobacco. No teeth pain. No ear pain. No nasal congestion or sinus pressure. Pt took one alleve last night and it felt little better.      Observations/Objective: General-no acute distress, pleasant, oriented. Lungs- on inspection lungs appear unlabored. Neck- no tracheal deviation or jvd on inspection. Neuro- gross motor function appears intact.  Assessment and Plan: By your desccription on exam concern for enlarged rt submandibular node. I do think best to prescribe keflex antibiotic and follow you closely. Need to do actual physical exam in 10 days or sooner if needed.  Need to determine if source of infection present. Also need to confirm if lymph node is swollen. Might get cbc or possible imaging studies if needed.  Follow up 10 days or as needed.  Rx antibiotic as we approach weekend and upcoming short holiday week.  Mackie Pai, PA-C  Follow Up Instructions:    I discussed the assessment and treatment plan with the patient. The patient was provided an opportunity to ask questions and all were answered. The patient agreed with the plan and demonstrated an understanding of  the instructions.   The patient was advised to call back or seek an in-person evaluation if the symptoms worsen or if the condition fails to improve as anticipated.  I provided 15  minutes of non-face-to-face time during this encounter.   Mackie Pai, PA-C    Review of Systems     Objective:   Physical Exam        Assessment & Plan:

## 2019-02-02 ENCOUNTER — Other Ambulatory Visit: Payer: Self-pay

## 2019-02-02 ENCOUNTER — Telehealth: Payer: Self-pay | Admitting: *Deleted

## 2019-02-02 NOTE — Telephone Encounter (Signed)
Copied from [place] [id number]. Topic: General - Inquiry >> Feb 02, 2019 12:59 PM Bruce [name] wrote: Reason for CRM: Pt stated he has a follow up appt with [facility] on 02/07/19 however he would like to know if Dr. Charlett Blake could fit him in next week. Pt has some swelling under his jaw that has not subsided. He has finished antibiotics . Please advise.

## 2019-02-07 ENCOUNTER — Encounter: Payer: Self-pay | Admitting: Family Medicine

## 2019-02-07 ENCOUNTER — Other Ambulatory Visit: Payer: Self-pay

## 2019-02-07 ENCOUNTER — Ambulatory Visit (HOSPITAL_BASED_OUTPATIENT_CLINIC_OR_DEPARTMENT_OTHER)
Admission: RE | Admit: 2019-02-07 | Discharge: 2019-02-07 | Disposition: A | Discharge status: Home / Self Care | Payer: Commercial Managed Care - PPO | Source: Ambulatory Visit | Attending: Family Medicine | Admitting: Family Medicine

## 2019-02-07 ENCOUNTER — Ambulatory Visit: Payer: Commercial Managed Care - PPO | Admitting: Medical

## 2019-02-07 ENCOUNTER — Ambulatory Visit: Payer: Commercial Managed Care - PPO | Admitting: Family Medicine

## 2019-02-07 VITALS — BP 110/72 | HR 70 | Temp 98.0°F | Resp 18 | Ht 68.0 in | Wt 183.2 lb

## 2019-02-07 DIAGNOSIS — R591 Generalized enlarged lymph nodes: Secondary | ICD-10-CM | POA: Diagnosis not present

## 2019-02-07 DIAGNOSIS — I7300 Raynaud's syndrome without gangrene: Secondary | ICD-10-CM

## 2019-02-07 DIAGNOSIS — Z23 Encounter for immunization: Secondary | ICD-10-CM | POA: Diagnosis not present

## 2019-02-07 DIAGNOSIS — R739 Hyperglycemia, unspecified: Secondary | ICD-10-CM

## 2019-02-07 DIAGNOSIS — R351 Nocturia: Secondary | ICD-10-CM | POA: Diagnosis not present

## 2019-02-07 DIAGNOSIS — E7800 Pure hypercholesterolemia, unspecified: Secondary | ICD-10-CM | POA: Diagnosis not present

## 2019-02-07 DIAGNOSIS — E039 Hypothyroidism, unspecified: Secondary | ICD-10-CM

## 2019-02-07 LAB — CBC WITH DIFFERENTIAL/PLATELET
Basophils Absolute: 0 10*3/uL (ref 0.0–0.1)
Basophils Relative: 0.6 % (ref 0.0–3.0)
Eosinophils Absolute: 0.2 10*3/uL (ref 0.0–0.7)
Eosinophils Relative: 2.5 % (ref 0.0–5.0)
HCT: 48.2 % (ref 39.0–52.0)
Hemoglobin: 16.6 g/dL (ref 13.0–17.0)
Lymphocytes Relative: 37 % (ref 12.0–46.0)
Lymphs Abs: 2.9 10*3/uL (ref 0.7–4.0)
MCHC: 34.5 g/dL (ref 30.0–36.0)
MCV: 90.9 fl (ref 78.0–100.0)
Monocytes Absolute: 0.8 10*3/uL (ref 0.1–1.0)
Monocytes Relative: 10.8 % (ref 3.0–12.0)
Neutro Abs: 3.8 10*3/uL (ref 1.4–7.7)
Neutrophils Relative %: 49.1 % (ref 43.0–77.0)
Platelets: 209 10*3/uL (ref 150.0–400.0)
RBC: 5.31 Mil/uL (ref 4.22–5.81)
RDW: 12.7 % (ref 11.5–15.5)
WBC: 7.8 10*3/uL (ref 4.0–10.5)

## 2019-02-07 LAB — LIPID PANEL
Cholesterol: 142 mg/dL (ref 0–200)
HDL: 36.1 mg/dL — ABNORMAL LOW (ref >39.00)
LDL Cholesterol: 84 mg/dL (ref 0–99)
NonHDL: 105.67
Total CHOL/HDL Ratio: 4
Triglycerides: 106 mg/dL (ref 0.0–149.0)
VLDL: 21.2 mg/dL (ref 0.0–40.0)

## 2019-02-07 LAB — COMPREHENSIVE METABOLIC PANEL
ALT: 19 U/L (ref 0–53)
AST: 17 U/L (ref 0–37)
Albumin: 4.1 g/dL (ref 3.5–5.2)
Alkaline Phosphatase: 61 U/L (ref 39–117)
BUN: 13 mg/dL (ref 6–23)
CO2: 30 mEq/L (ref 19–32)
Calcium: 9.3 mg/dL (ref 8.4–10.5)
Chloride: 103 mEq/L (ref 96–112)
Creatinine, Ser: 0.89 mg/dL (ref 0.40–1.50)
GFR: 90.38 mL/min (ref >60.00)
Glucose, Bld: 84 mg/dL (ref 70–99)
Potassium: 4.2 mEq/L (ref 3.5–5.1)
Sodium: 139 mEq/L (ref 135–145)
Total Bilirubin: 0.7 mg/dL (ref 0.2–1.2)
Total Protein: 7 g/dL (ref 6.0–8.3)

## 2019-02-07 LAB — HEMOGLOBIN A1C: Hgb A1c MFr Bld: 5.3 % (ref 4.6–6.5)

## 2019-02-07 LAB — PSA: PSA: 1.54 ng/mL (ref 0.10–4.00)

## 2019-02-07 LAB — TSH: TSH: 0.7 u[IU]/mL (ref 0.35–4.50)

## 2019-02-07 NOTE — Assessment & Plan Note (Signed)
PSA ordered, FH significant for a father who had prostate ca

## 2019-02-07 NOTE — Assessment & Plan Note (Signed)
On Levothyroxine, continue to monitor 

## 2019-02-07 NOTE — Progress Notes (Signed)
Subjective:    Patient ID: Bruce [name], male    DOB: 01-18-1971, 48 y.o.   MRN: [id number]  No chief complaint on file.   HPI Patient is in today for evaluation of submandiular mass on right side and follow up on chronic medical concerns including hyperlipidemia, hypothyroidism, and more. No recent febrile illness or hospitalizations. Notes some nocturia but no c/o dysuria. Noted firm lesion under chin at base of neck on right hand side 2 weeks ago. It is slower diminishing in size he thinks and is not painful. He did note an oral ulcer about the same time that has resolved. He does have an appt with his dentist later today to have this evaluated. Denies CP/palp/SOB/HA/congestion/fevers/GI or GU c/o. Taking meds as prescribed. Notes bilateral feet and sometimes hands get overly cold and even purplish and white at times.   Past Medical History:  Diagnosis Date  . Allergic state 06/24/2015  . 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  . Environmental and seasonal allergies 06/24/2015  . H/O jaundice 10/26/2015   7 th grade caused by food, no recurrence  . History of influenza 2012   influenza A + ([facility])  . Hypothyroidism    Managed by endo ([facility])  . Inguinal hernia 06/24/2015  . Snoring 06/24/2015  . SOB (shortness of breath) 11/02/2016    Past Surgical History:  Procedure Laterality Date  . sleep study  01/2012   Home sleep study via [employer] NORMAL    Family History  Problem Relation Age of Onset  . Hypertension Mother   . Diabetes Mother   . Prostate cancer Father   . Cancer Father 2       prostate  . Other Son        bown with hydronephrosis, resolved with surgical intervention  . Thyroid disease Neg Hx   . Heart disease Neg Hx     Social History   Socioeconomic History  . Marital status: Married    Spouse  name: Not on file  . Number of children: Not on file  . Years of education: Not on file  . Highest education level: Not on file  Occupational History  . Not on file  Social Needs  . Financial resource strain: Not on file  . Food insecurity    Worry: Not on file    Inability: Not on file  . Transportation needs    Medical: Not on file    Non-medical: Not on file  Tobacco Use  . Smoking status: Never Smoker  . Smokeless tobacco: Never Used  Substance and Sexual Activity  . Alcohol use: Yes    Alcohol/week: 1.0 standard drinks    Types: 1 Shots of liquor per week    Comment: occasionally  . Drug use: No  . Sexual activity: Yes  Lifestyle  . Physical activity    Days per week: Not on file    Minutes per session: Not on file  . Stress: Not on file  Relationships  . Social Musician on phone: Not on file    Gets together: Not on file    Attends religious service: Not on file    Active member of club or organization: Not on file    Attends meetings of clubs or organizations: Not on file    Relationship  status: Not on file  . Intimate partner violence    Fear of current or ex partner: Not on file    Emotionally abused: Not on file    Physically abused: Not on file    Forced sexual activity: Not on file  Other Topics Concern  . Not on file  Social History Narrative   Married, 2 children    Master's degree in [place].  Emergency planning/management officer for [employer].   No tobacco, rare alcohol, no drug use.   Exercises regularly.  Typical american diet except avoids red meat.     Outpatient Medications Prior to Visit  Medication Sig Dispense Refill  . levothyroxine (SYNTHROID) 112 MCG tablet Take 1 tablet (112 mcg total) by mouth daily. 90 tablet 2  . rosuvastatin (CRESTOR) 5 MG tablet Take 1 tablet (5 mg total) by mouth daily. 90 tablet 2  . cephALEXin (KEFLEX) 500 MG capsule Take 1 capsule (500 mg total) by mouth 2 (two) times daily. 20 capsule 0   No facility-administered  medications prior to visit.     No Known Allergies  Review of Systems  Constitutional: Negative for fever and malaise/fatigue.  HENT: Negative for congestion.   Eyes: Negative for blurred vision.  Respiratory: Negative for shortness of breath.   Cardiovascular: Negative for chest pain, palpitations and leg swelling.  Gastrointestinal: Negative for abdominal pain, blood in stool and nausea.  Genitourinary: Negative for dysuria and frequency.  Musculoskeletal: Negative for falls.  Skin: Negative for rash.  Neurological: Negative for dizziness, loss of consciousness and headaches.  Endo/Heme/Allergies: Negative for environmental allergies.  Psychiatric/Behavioral: Negative for depression. The patient is not nervous/anxious.        Objective:    Physical Exam Vitals signs and nursing note reviewed.  Constitutional:      General: He is not in acute distress.    Appearance: He is well-developed.  HENT:     Head: Normocephalic and atraumatic.     Comments: 1 cm firm, fixed mass submandibular region on right.     Nose: Nose normal.  Eyes:     General:        Right eye: No discharge.        Left eye: No discharge.  Neck:     Musculoskeletal: Normal range of motion and neck supple.  Cardiovascular:     Rate and Rhythm: Normal rate and regular rhythm.     Heart sounds: No murmur.  Pulmonary:     Effort: Pulmonary effort is normal.     Breath sounds: Normal breath sounds.  Abdominal:     General: Bowel sounds are normal.     Palpations: Abdomen is soft.     Tenderness: There is no abdominal tenderness.  Skin:    General: Skin is warm and dry.  Neurological:     Mental Status: He is alert and oriented to person, place, and time.     BP 110/72 (BP Location: Left Arm, Patient Position: Sitting, Cuff Size: Normal)   Pulse 70   Temp 98 F (36.7 C) (Temporal)   Resp 18   Ht 5\' 8"  (1.727 m)   Wt 183 lb 3.2 oz (83.1 kg)   SpO2 98%   BMI 27.86 kg/m  Wt Readings from Last 3  Encounters:  02/07/19 183 lb 3.2 oz (83.1 kg)  11/27/17 179 lb (81.2 kg)  08/24/17 185 lb 3.2 oz (84 kg)    Diabetic Foot Exam - Simple   No data filed  Lab Results  Component Value Date   WBC 5.8 10/29/2016   HGB 17.0 09/13/2018   HCT 45.5 10/29/2016   PLT 210.0 10/29/2016   GLUCOSE 89 10/29/2016   CHOL 146 11/24/2017   TRIG 109 09/13/2018   HDL 41 11/24/2017   LDLDIRECT 147.2 11/22/2010   LDLCALC 95 09/13/2018   ALT 18 10/29/2016   AST 17 10/29/2016   NA 140 10/29/2016   K 4.4 10/29/2016   CL 105 10/29/2016   CREATININE 1.06 10/29/2016   BUN 13 10/29/2016   CO2 30 10/29/2016   TSH 0.85 09/13/2018   PSA 0.99 10/29/2016   HGBA1C 5.3 09/13/2018    Lab Results  Component Value Date   TSH 0.85 09/13/2018   Lab Results  Component Value Date   WBC 5.8 10/29/2016   HGB 17.0 09/13/2018   HCT 45.5 10/29/2016   MCV 91.4 10/29/2016   PLT 210.0 10/29/2016   Lab Results  Component Value Date   NA 140 10/29/2016   K 4.4 10/29/2016   CO2 30 10/29/2016   GLUCOSE 89 10/29/2016   BUN 13 10/29/2016   CREATININE 1.06 10/29/2016   BILITOT 0.8 10/29/2016   ALKPHOS 58 10/29/2016   AST 17 10/29/2016   ALT 18 10/29/2016   PROT 6.6 10/29/2016   ALBUMIN 4.1 10/29/2016   CALCIUM 9.2 10/29/2016   GFR 79.25 10/29/2016   Lab Results  Component Value Date   CHOL 146 11/24/2017   Lab Results  Component Value Date   HDL 41 11/24/2017   Lab Results  Component Value Date   LDLCALC 95 09/13/2018   Lab Results  Component Value Date   TRIG 109 09/13/2018   Lab Results  Component Value Date   CHOLHDL 5 08/21/2017   Lab Results  Component Value Date   HGBA1C 5.3 09/13/2018       Assessment & Plan:   Problem List Items Addressed This Visit    Hypothyroidism    On Levothyroxine, continue to monitor      Relevant Orders   TSH   Pure hypercholesterolemia - Primary    Tolerating statin, encouraged heart healthy diet, avoid trans fats, minimize simple carbs  and saturated fats. Increase exercise as tolerated      Relevant Orders   Lipid panel   CMP   Hyperglycemia    hgba1c acceptable, minimize simple carbs. Increase exercise as tolerated. He is moving to [place] in next 2 weeks so will update labs and he will find providers closer to home over time      Relevant Orders   A1C   Nocturia    PSA ordered, FH significant for a father who had prostate ca      Relevant Orders   PSA   Lymphadenopathy    Noted firm lesion under chin at base of neck on right hand side 2 weeks ago. It is slower diminishing in size he thinks and is not painful. He did note an oral ulcer about the same time that has resolved. He does have an appt with his dentist later today to have this evaluated. Due to the firmness and impending move. Will proceed with ultrasound of soft tissue of neck to evaluate fully and a cbc with diff. If it does not resolve and work up is negative he needs to seek care for further work up      Relevant Orders   Lipid panel   CBC w/Diff   US SOFT TISSUE HEAD AND NECK ([facility])  Raynaud phenomenon    Encouraged to stay warm with good socks and gloves and seek care if worsens.          I have discontinued Kanye Ferrando's cephALEXin. I am also having him maintain his levothyroxine and rosuvastatin.  No orders of the defined types were placed in this encounter.    Penni Homans, MD

## 2019-02-07 NOTE — Patient Instructions (Addendum)
Pulse oximeter want oxygen in 90s   Omron Blood pressure cuff, upper arm  Multivitamin with minerals, selenium Vitamin D 1000 IU daily ECASA 81 mg daily   Call or send my chart message when settled with new address so we can send the cologuard kit Lymphadenopathy  Lymphadenopathy means that your lymph glands are swollen or larger than normal (enlarged). Lymph glands, also called lymph nodes, are collections of tissue that filter bacteria, viruses, and waste from your bloodstream. They are part of your body's disease-fighting system (immune system), which protects your body from germs. There may be different causes of lymphadenopathy, depending on where it is in your body. Some types go away on their own. Lymphadenopathy can occur anywhere that you have lymph glands, including these areas:  Neck (cervical lymphadenopathy).  Chest (mediastinal lymphadenopathy).  Lungs (hilar lymphadenopathy).  Underarms (axillary lymphadenopathy).  Groin (inguinal lymphadenopathy). When your immune system responds to germs, infection-fighting cells and fluid build up in your lymph glands. This causes some swelling and enlargement. If the lymph glands do not go back to normal after you have an infection or disease, your health care provider may do tests. These tests help to monitor your condition and find the reason why the glands are still swollen and enlarged. Follow these instructions at home:  Get plenty of rest.  Take over-the-counter and prescription medicines only as told by your health care provider. Your health care provider may recommend over-the-counter medicines for pain.  If directed, apply heat to swollen lymph glands as often as told by your health care provider. Use the heat source that your health care provider recommends, such as a moist heat pack or a heating pad. ? Place a towel between your skin and the heat source. ? Leave the heat on for 20-30 minutes. ? Remove the heat if your  skin turns bright red. This is especially important if you are unable to feel pain, heat, or cold. You may have a greater risk of getting burned.  Check your affected lymph glands every day for changes. Check other lymph gland areas as told by your health care provider. Check for changes such as: ? More swelling. ? Sudden increase in size. ? Redness or pain. ? Hardness.  Keep all follow-up visits as told by your health care provider. This is important. Contact a health care provider if you have:  Swelling that gets worse or spreads to other areas.  Problems with breathing.  Lymph glands that: ? Are still swollen after 2 weeks. ? Have suddenly gotten bigger. ? Are red, painful, or hard.  A fever or chills.  Fatigue.  A sore throat.  Pain in your abdomen.  Weight loss.  Night sweats. Get help right away if you have:  Fluid leaking from an enlarged lymph gland.  Severe pain.  Chest pain.  Shortness of breath. Summary  Lymphadenopathy means that your lymph glands are swollen or larger than normal (enlarged).  Lymph glands (also called lymph nodes) are collections of tissue that filter bacteria, viruses, and waste from the bloodstream. They are part of your body's disease-fighting system (immune system).  Lymphadenopathy can occur anywhere that you have lymph glands.  If your enlarged and swollen lymph glands do not go back to normal after you have an infection or disease, your health care provider may do tests to monitor your condition and find the reason why the glands are still swollen and enlarged.  Check your affected lymph glands every day for changes. Check  other lymph gland areas as told by your health care provider. This information is not intended to replace advice given to you by your health care provider. Make sure you discuss any questions you have with your health care provider. Document Released: 12/04/2007 Document Revised: 02/06/2017 Document Reviewed:  01/09/2017 Elsevier Patient Education  2020 [place] phenomenon is a condition that affects the blood vessels (arteries) that carry blood to your fingers and toes. The arteries that supply blood to your ears, lips, nipples, or the tip of your nose might also be affected. Raynaud phenomenon causes the arteries to become narrow temporarily (spasm). As a result, the flow of blood to the affected areas is temporarily decreased. This usually occurs in response to cold temperatures or stress. During an attack, the skin in the affected areas turns white, then blue, and finally red. You may also feel tingling or numbness in those areas. Attacks usually last for only a brief period, and then the blood flow to the area returns to normal. In most cases, Raynaud phenomenon does not cause serious health problems. What are the causes? In many cases, the cause of this condition is not known. The condition may occur on its own (primary Raynaud phenomenon) or may be associated with other diseases or factors (secondary Raynaud phenomenon). Possible causes may include:  Diseases or medical conditions that damage the arteries.  Injuries and repetitive actions that hurt the hands or feet.  Being exposed to certain chemicals.  Taking medicines that narrow the arteries.  Other medical conditions, such as lupus, scleroderma, rheumatoid arthritis, thyroid problems, blood disorders, Sjogren syndrome, or atherosclerosis. What increases the risk? The following factors may make you more likely to develop this condition:  Being 7-24 years old.  Being male.  Having a family history of Raynaud phenomenon.  Living in a cold climate.  Smoking. What are the signs or symptoms? Symptoms of this condition usually occur when you are exposed to cold temperatures or when you have emotional stress. The symptoms may last for a few minutes or up to several hours. They usually affect your  fingers but may also affect your toes, nipples, lips, ears, or the tip of your nose. Symptoms may include:  Changes in skin color. The skin in the affected areas will turn pale or white. The skin may then change from white to bluish to red as normal blood flow returns to the area.  Numbness, tingling, or pain in the affected areas. In severe cases, symptoms may include:  Skin sores.  Tissues decaying and dying (gangrene). How is this diagnosed? This condition may be diagnosed based on:  Your symptoms and medical history.  A physical exam. During the exam, you may be asked to put your hands in cold water to check for a reaction to cold temperature.  Tests, such as: ? Blood tests to check for other diseases or conditions. ? A test to check the movement of blood through your arteries and veins (vascular ultrasound). ? A test in which the skin at the base of your fingernail is examined under a microscope (nailfold capillaroscopy). How is this treated? Treatment for this condition often involves making lifestyle changes and taking steps to control your exposure to cold temperatures. For more severe cases, medicine (calcium channel blockers) may be used to improve blood flow. Surgery is sometimes done to block the nerves that control the affected arteries, but this is rare. Follow these instructions at home: Avoiding cold temperatures Take  these steps to avoid exposure to cold:  If possible, stay indoors during cold weather.  When you go outside during cold weather, dress in layers and wear mittens, a hat, a scarf, and warm footwear.  Wear mittens or gloves when handling ice or frozen food.  Use holders for glasses or cans containing cold drinks.  Let warm water run for a while before taking a shower or bath.  Warm up the car before driving in cold weather. Lifestyle   If possible, avoid stressful and emotional situations. Try to find ways to manage your stress, such  as: ? Exercise. ? Yoga. ? Meditation. ? Biofeedback.  Do not use any products that contain nicotine or tobacco, such as cigarettes and e-cigarettes. If you need help quitting, ask your health care provider.  Avoid secondhand smoke.  Limit your use of caffeine. ? Switch to decaffeinated coffee, tea, and soda. ? Avoid chocolate.  Avoid vibrating tools and machinery. General instructions  Protect your hands and feet from injuries, cuts, or bruises.  Avoid wearing tight rings or wristbands.  Wear loose fitting socks and comfortable, roomy shoes.  Take over-the-counter and prescription medicines only as told by your health care provider. Contact a health care provider if:  Your discomfort becomes worse despite lifestyle changes.  You develop sores on your fingers or toes that do not heal.  Your fingers or toes turn black.  You have breaks in the skin on your fingers or toes.  You have a fever.  You have pain or swelling in your joints.  You have a rash.  Your symptoms occur on only one side of your body. Summary  Raynaud phenomenon is a condition that affects the arteries that carry blood to your fingers, toes, ears, lips, nipples, or the tip of your nose.  In many cases, the cause of this condition is not known.  Symptoms of this condition include changes in skin color, and numbness and tingling of the affected area.  Treatment for this condition includes lifestyle changes, reducing exposure to cold temperatures, and using medicines for severe cases of the condition.  Contact your health care provider if your condition worsens despite treatment. This information is not intended to replace advice given to you by your health care provider. Make sure you discuss any questions you have with your health care provider. Document Released: 02/22/2000 Document Revised: 02/27/2017 Document Reviewed: 04/07/2016 Elsevier Patient Education  2020 [employer].

## 2019-02-07 NOTE — Assessment & Plan Note (Signed)
hgba1c acceptable, minimize simple carbs. Increase exercise as tolerated. He is moving to [place] in next 2 weeks so will update labs and he will find providers closer to home over time

## 2019-02-07 NOTE — Assessment & Plan Note (Addendum)
Noted firm lesion under chin at base of neck on right hand side 2 weeks ago. It is slower diminishing in size he thinks and is not painful. He did note an oral ulcer about the same time that has resolved. He does have an appt with his dentist later today to have this evaluated. Due to the firmness and impending move. Will proceed with ultrasound of soft tissue of neck to evaluate fully and a cbc with diff. If it does not resolve and work up is negative he needs to seek care for further work up

## 2019-02-07 NOTE — Assessment & Plan Note (Signed)
Tolerating statin, encouraged heart healthy diet, avoid trans fats, minimize simple carbs and saturated fats. Increase exercise as tolerated 

## 2019-02-07 NOTE — Assessment & Plan Note (Signed)
Encouraged to stay warm with good socks and gloves and seek care if worsens.

## 2019-06-23 ENCOUNTER — Other Ambulatory Visit: Payer: Self-pay | Admitting: Endocrinology

## 2019-09-13 ENCOUNTER — Other Ambulatory Visit: Payer: Commercial Managed Care - PPO

## 2019-09-15 ENCOUNTER — Ambulatory Visit: Payer: Commercial Managed Care - PPO | Admitting: Endocrinology

## 2019-09-15 ENCOUNTER — Other Ambulatory Visit: Payer: Commercial Managed Care - PPO

## 2019-09-21 ENCOUNTER — Ambulatory Visit: Payer: Commercial Managed Care - PPO | Admitting: Endocrinology

## 2020-08-03 IMAGING — US US SOFT TISSUE HEAD/NECK
1 series · 14 of 23 positions shown · non-contrast
Comparison: None.

CLINICAL DATA: Firm 1 cm mass in the right submandibular region.

EXAM:
ULTRASOUND OF HEAD/NECK SOFT TISSUES
TECHNIQUE: Ultrasound examination of the head and neck soft tissues was
performed in the area of clinical concern.

[Series 1: us soft tissue head/neck · 23 acquisitions, 14 frames shown]
[im 1/23]
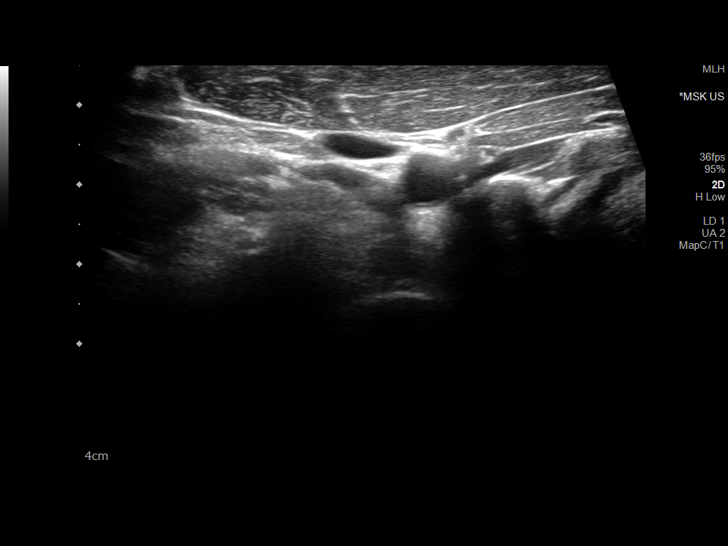
[im 3/23]
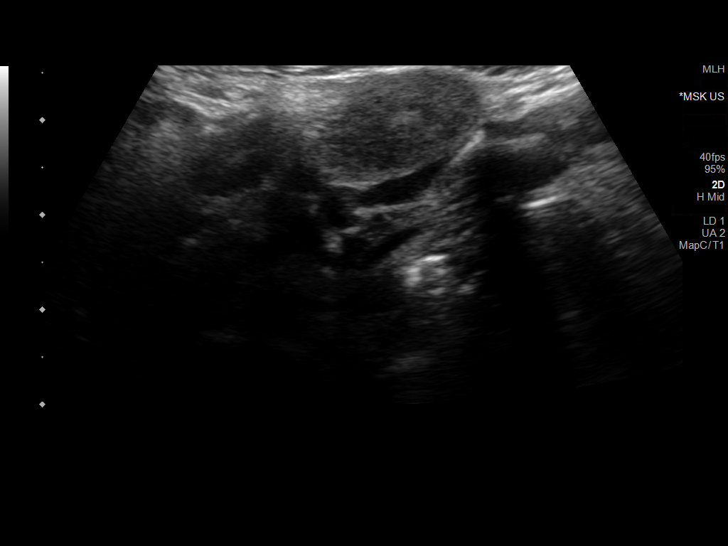
[im 5/23]
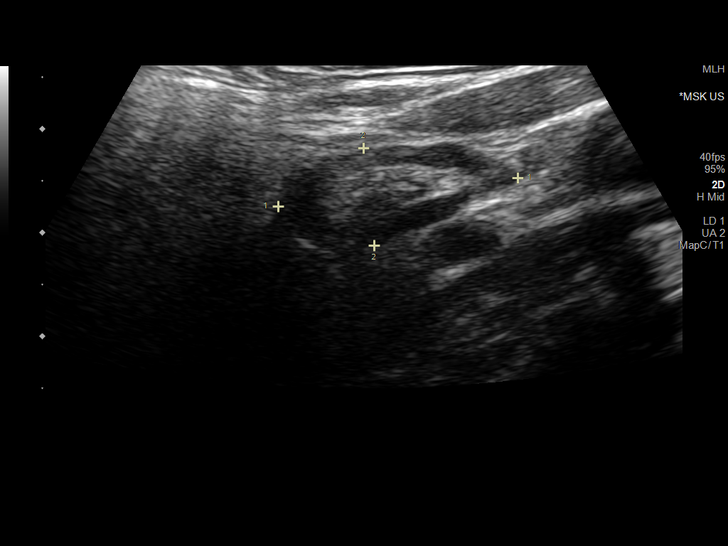
[im 6/23]
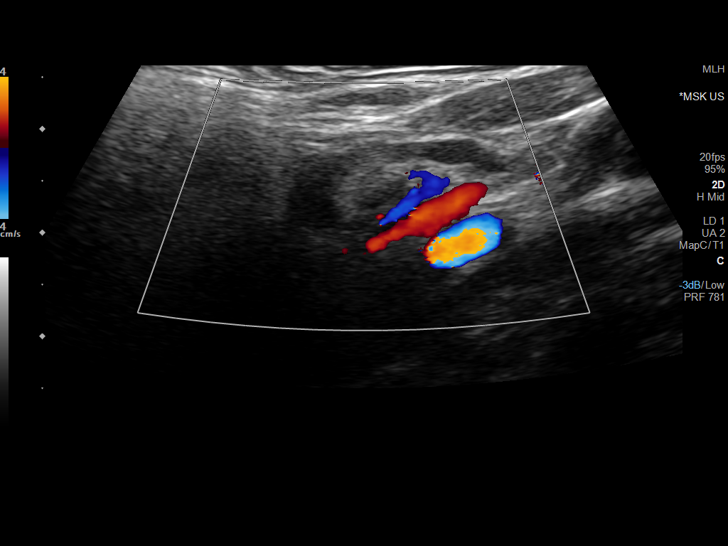
[im 8/23]
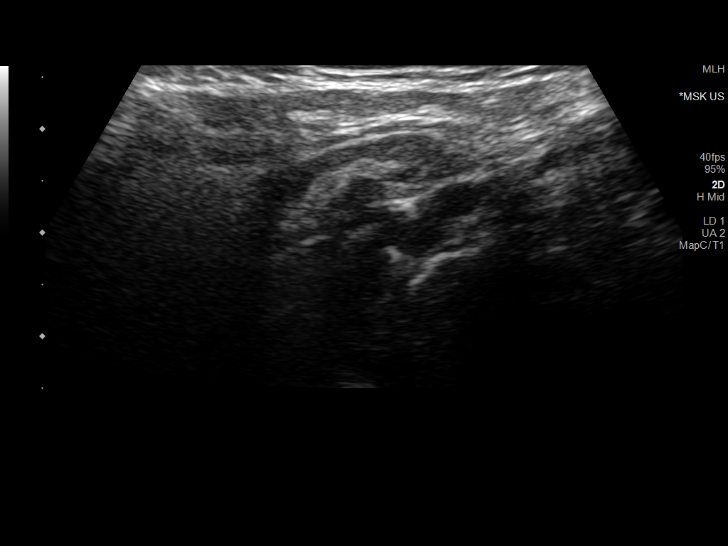
[im 10/23]
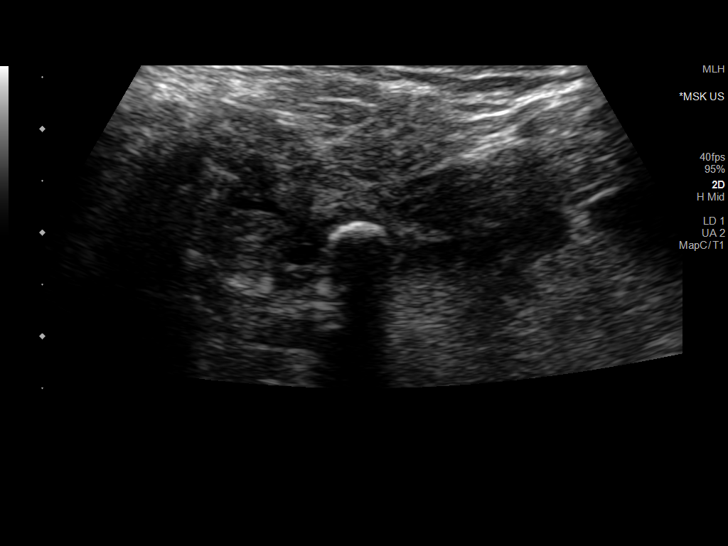
[im 11/23]
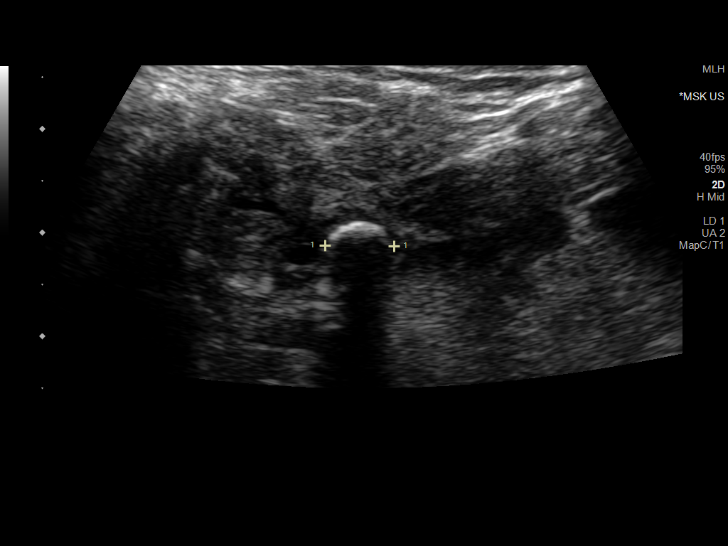
[im 13/23]
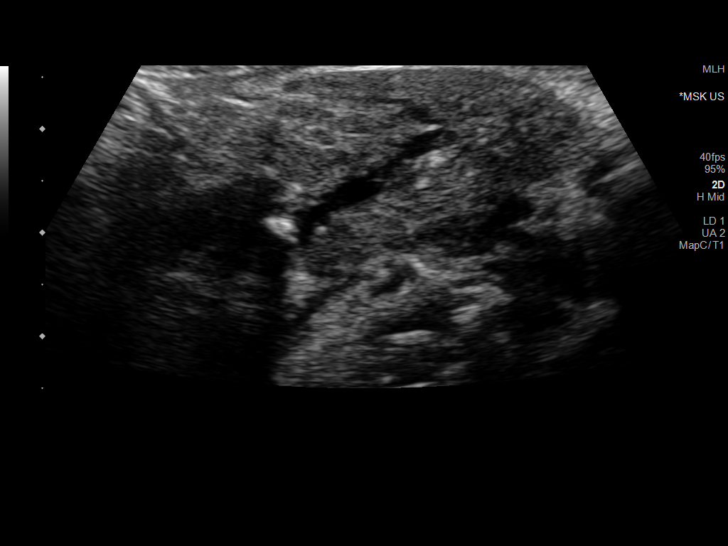
[im 14/23]
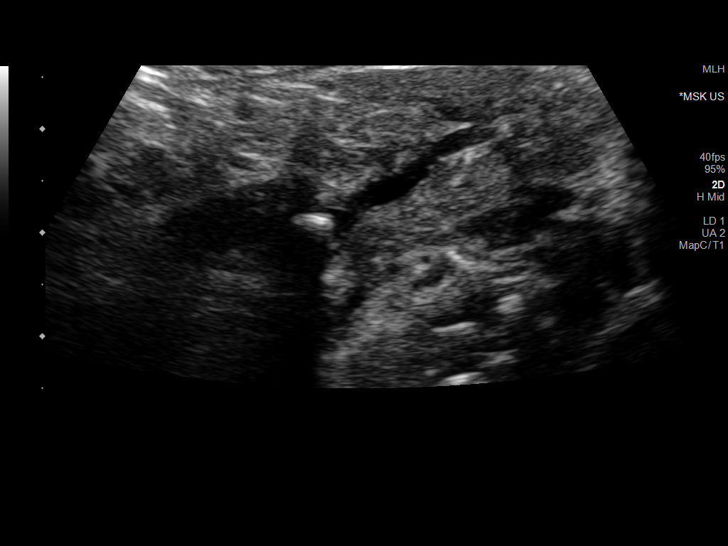
[im 16/23]
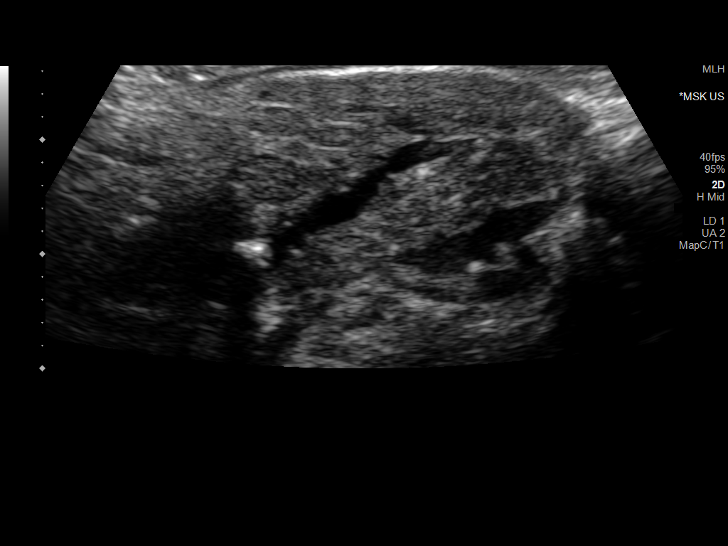
[im 18/23]
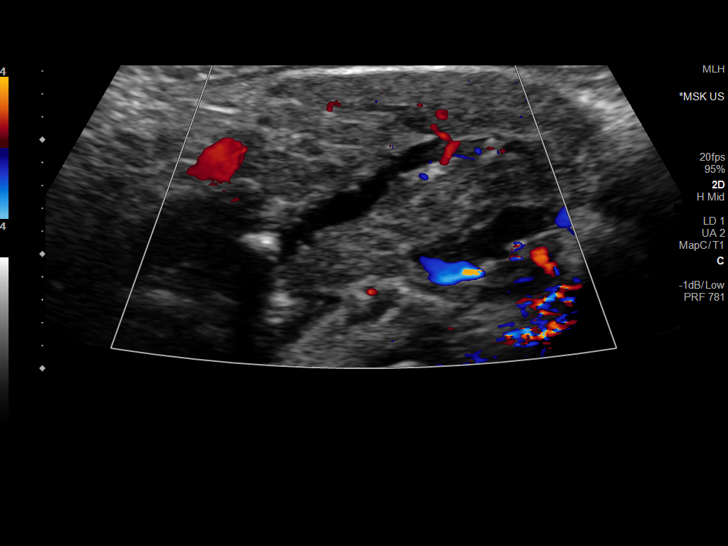
[im 19/23]
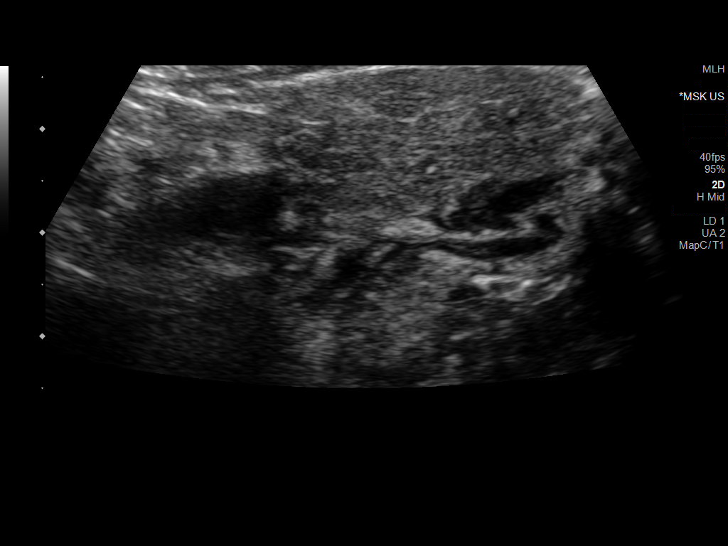
[im 21/23]
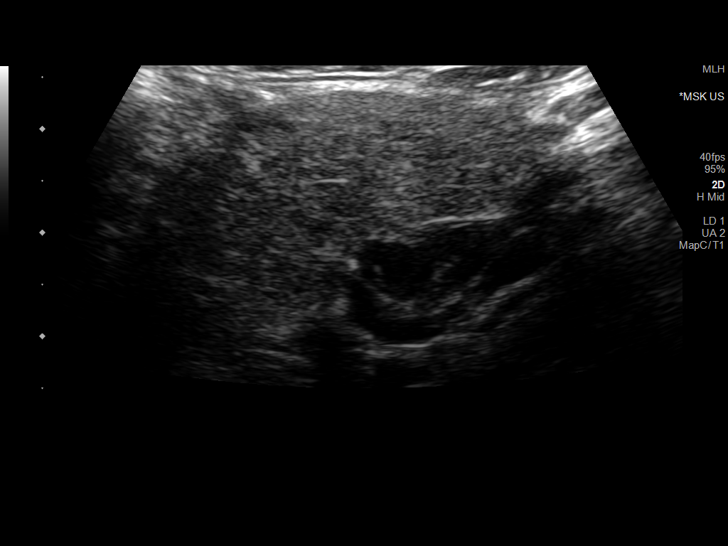
[im 23/23]
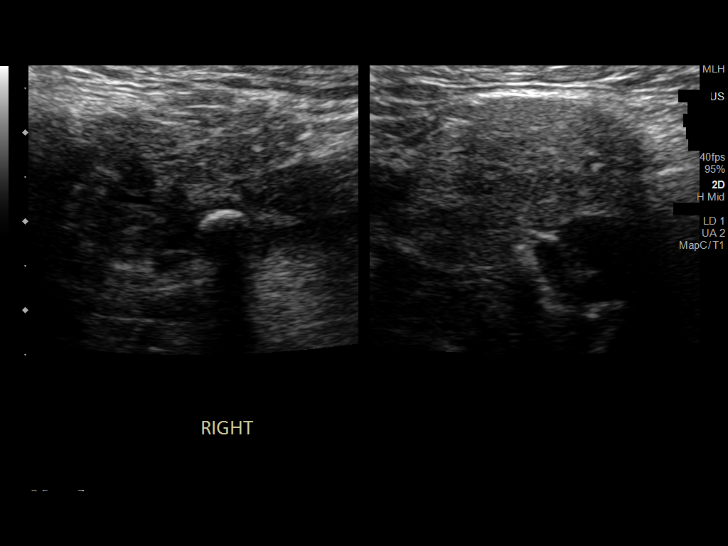

[14 of 23 positions shown; findings below may reference images not displayed]

FINDINGS: In the right submandibular region there is a 2.3 x 1 cm lymph node
that demonstrates a normal fatty hilum. In the region of the right
submandibular gland there is a 0.5 x 0.3 by 0.7 cm shadowing
echogenic stone. There is a dilated submandibular duct (Jaylon
Neomi) measuring approximately 3 mm in diameter.
IMPRESSION: 1. Sialolithiasis of the right Wharton's duct as detailed above.
2. Enlarged, likely reactive lymph node corresponding to the
patient's palpable area of concern.
